# Patient Record
Sex: Male | Born: 1975 | Race: White | Hispanic: No | Marital: Married | Smoking: Former smoker
Health system: Southern US, Community
[De-identification: ages and names within clinical notes are randomized; demographics above are authoritative.]

## PROBLEM LIST (undated history)

## (undated) DIAGNOSIS — F4310 Post-traumatic stress disorder, unspecified: Secondary | ICD-10-CM

## (undated) DIAGNOSIS — N529 Male erectile dysfunction, unspecified: Secondary | ICD-10-CM

## (undated) DIAGNOSIS — E119 Type 2 diabetes mellitus without complications: Secondary | ICD-10-CM

## (undated) DIAGNOSIS — M1990 Unspecified osteoarthritis, unspecified site: Secondary | ICD-10-CM

## (undated) DIAGNOSIS — E781 Pure hyperglyceridemia: Secondary | ICD-10-CM

## (undated) DIAGNOSIS — I10 Essential (primary) hypertension: Secondary | ICD-10-CM

## (undated) HISTORY — DX: Pure hyperglyceridemia: E78.1

## (undated) HISTORY — PX: NO PAST SURGERIES: SHX2092

---

## 1997-11-24 ENCOUNTER — Emergency Department (HOSPITAL_COMMUNITY): Admission: EM | Admit: 1997-11-24 | Discharge: 1997-11-24 | Payer: Self-pay | Admitting: Emergency Medicine

## 2016-01-27 ENCOUNTER — Encounter (INDEPENDENT_AMBULATORY_CARE_PROVIDER_SITE_OTHER): Payer: Self-pay

## 2016-01-27 ENCOUNTER — Encounter: Payer: Self-pay | Admitting: Sports Medicine

## 2016-01-27 ENCOUNTER — Ambulatory Visit (INDEPENDENT_AMBULATORY_CARE_PROVIDER_SITE_OTHER): Payer: BLUE CROSS/BLUE SHIELD

## 2016-01-27 ENCOUNTER — Ambulatory Visit (INDEPENDENT_AMBULATORY_CARE_PROVIDER_SITE_OTHER): Payer: BLUE CROSS/BLUE SHIELD | Admitting: Sports Medicine

## 2016-01-27 DIAGNOSIS — L723 Sebaceous cyst: Secondary | ICD-10-CM | POA: Diagnosis not present

## 2016-01-27 DIAGNOSIS — Z0000 Encounter for general adult medical examination without abnormal findings: Secondary | ICD-10-CM

## 2016-01-27 DIAGNOSIS — M19072 Primary osteoarthritis, left ankle and foot: Secondary | ICD-10-CM | POA: Diagnosis not present

## 2016-01-27 DIAGNOSIS — E781 Pure hyperglyceridemia: Secondary | ICD-10-CM

## 2016-01-27 DIAGNOSIS — L84 Corns and callosities: Secondary | ICD-10-CM | POA: Diagnosis not present

## 2016-01-27 DIAGNOSIS — M2012 Hallux valgus (acquired), left foot: Secondary | ICD-10-CM

## 2016-01-27 LAB — HIV ANTIBODY (ROUTINE TESTING W REFLEX): HIV 1&2 Ab, 4th Generation: NONREACTIVE

## 2016-01-27 LAB — COMPREHENSIVE METABOLIC PANEL WITH GFR
ALT: 46 U/L (ref 9–46)
AST: 26 U/L (ref 10–40)
Alkaline Phosphatase: 128 U/L — ABNORMAL HIGH (ref 40–115)
BUN: 14 mg/dL (ref 7–25)
Creat: 1.09 mg/dL (ref 0.60–1.35)
Glucose, Bld: 106 mg/dL — ABNORMAL HIGH (ref 65–99)
Total Bilirubin: 0.4 mg/dL (ref 0.2–1.2)

## 2016-01-27 LAB — HEMOGLOBIN A1C
Hgb A1c MFr Bld: 5.7 % — ABNORMAL HIGH (ref <5.7)
Mean Plasma Glucose: 117 mg/dL

## 2016-01-27 LAB — CBC
HCT: 45.4 % (ref 38.5–50.0)
Hemoglobin: 15.3 g/dL (ref 13.2–17.1)
MCH: 29.4 pg (ref 27.0–33.0)
MCHC: 33.7 g/dL (ref 32.0–36.0)
MCV: 87.3 fL (ref 80.0–100.0)
MPV: 12 fL (ref 7.5–12.5)
Platelets: 211 K/uL (ref 140–400)
RBC: 5.2 MIL/uL (ref 4.20–5.80)
RDW: 14 % (ref 11.0–15.0)
WBC: 8.3 K/uL (ref 3.8–10.8)

## 2016-01-27 LAB — LIPID PANEL
Cholesterol: 191 mg/dL (ref <200)
HDL: 21 mg/dL — ABNORMAL LOW (ref >40)
LDL Cholesterol: 100 mg/dL — ABNORMAL HIGH (ref <100)
Total CHOL/HDL Ratio: 9.1 Ratio — ABNORMAL HIGH (ref <5.0)
Triglycerides: 352 mg/dL — ABNORMAL HIGH (ref <150)
VLDL: 70 mg/dL — ABNORMAL HIGH (ref <30)

## 2016-01-27 LAB — COMPREHENSIVE METABOLIC PANEL
Albumin: 4.5 g/dL (ref 3.6–5.1)
CO2: 27 mmol/L (ref 20–31)
Calcium: 9.8 mg/dL (ref 8.6–10.3)
Chloride: 102 mmol/L (ref 98–110)
Potassium: 5.2 mmol/L (ref 3.5–5.3)
Sodium: 139 mmol/L (ref 135–146)
Total Protein: 7.7 g/dL (ref 6.1–8.1)

## 2016-01-27 LAB — TSH: TSH: 2.35 m[IU]/L (ref 0.40–4.50)

## 2016-01-27 MED ORDER — MELOXICAM 15 MG PO TABS: ORAL_TABLET | ORAL | 3 refills | Status: DC

## 2016-01-27 NOTE — Assessment & Plan Note (Signed)
X-rays, meloxicam, if no better in one month we can inject this.

## 2016-01-27 NOTE — Progress Notes (Signed)
  Subjective:    CC: Establish care.   HPI:  This is a pleasant 40 year old male, he comes in with only a few complaints.  Preventive measures: Needs routine blood work obtained.  Sebaceous cyst: Back of the neck, desires surgical excision.  Lesion on right thumb: Over the pad of the thumb, mild pain.  Left foot pain: Localized at the first MTP.  Past medical history:  Negative.  See flowsheet/record as well for more information.  Surgical history: Negative.  See flowsheet/record as well for more information.  Family history: Negative.  See flowsheet/record as well for more information.  Social history: Negative.  See flowsheet/record as well for more information.  Allergies, and medications have been entered into the medical record, reviewed, and no changes needed.    Review of Systems: No headache, visual changes, nausea, vomiting, diarrhea, constipation, dizziness, abdominal pain, skin rash, fevers, chills, night sweats, swollen lymph nodes, weight loss, chest pain, body aches, joint swelling, muscle aches, shortness of breath, mood changes, visual or auditory hallucinations.  Objective:    General: Well Developed, well nourished, and in no acute distress.  Neuro: Alert and oriented x3, extra-ocular muscles intact, sensation grossly intact.  HEENT: Normocephalic, atraumatic, pupils equal round reactive to light, neck supple, no masses, no lymphadenopathy, thyroid nonpalpable. There is a nearly fist-sized sebaceous cyst in the midline of the posterior neck  Skin: Warm and dry, no rashes noted.  Cardiac: Regular rate and rhythm, no murmurs rubs or gallops.  Respiratory: Clear to auscultation bilaterally. Not using accessory muscles, speaking in full sentences.  Abdominal: Soft, nontender, nondistended, positive bowel sounds, no masses, no organomegaly.  Left foot: Visible hallux valgus, tender to palpation at the first MTP. Right thumb: Corn in the pad of the thumb.  Impression  and Recommendations:    The patient was counselled, risk factors were discussed, anticipatory guidance given.  Annual physical exam Up-to-date on tetanus, checking routine blood work.  Sebaceous cyst Return in a 30 minute slot for surgical excision.  Corn I can core out the corn at his sebaceous cyst appointment  Hallux valgus, left X-rays, meloxicam, if no better in one month we can inject this.

## 2016-01-27 NOTE — Assessment & Plan Note (Signed)
Return in a 30 minute slot for surgical excision.

## 2016-01-27 NOTE — Assessment & Plan Note (Signed)
Up-to-date on tetanus, checking routine blood work.

## 2016-01-27 NOTE — Assessment & Plan Note (Signed)
I can core out the corn at his sebaceous cyst appointment

## 2016-01-28 DIAGNOSIS — E781 Pure hyperglyceridemia: Secondary | ICD-10-CM

## 2016-01-28 HISTORY — DX: Pure hyperglyceridemia: E78.1

## 2016-01-28 LAB — VITAMIN D 25 HYDROXY (VIT D DEFICIENCY, FRACTURES): Vit D, 25-Hydroxy: 20 ng/mL — ABNORMAL LOW (ref 30–100)

## 2016-01-28 MED ORDER — VITAMIN D (ERGOCALCIFEROL) 1.25 MG (50000 UNIT) PO CAPS: 50000.0000 [IU] | ORAL_CAPSULE | ORAL | 0 refills | Status: DC

## 2016-01-28 MED ORDER — FENOFIBRATE 160 MG PO TABS: 160.0000 mg | ORAL_TABLET | Freq: Every day | ORAL | 3 refills | Status: DC

## 2016-01-28 NOTE — Addendum Note (Signed)
Addended by: Monica BectonHEKKEKANDAM, THOMAS J on: 01/28/2016 09:09 AM   Modules accepted: Orders

## 2016-01-28 NOTE — Assessment & Plan Note (Signed)
Isolated hypertriglyceridemia, adding fenofibrate 160.  Recheck in 3 months.

## 2016-02-04 ENCOUNTER — Ambulatory Visit (INDEPENDENT_AMBULATORY_CARE_PROVIDER_SITE_OTHER): Payer: BLUE CROSS/BLUE SHIELD | Admitting: Sports Medicine

## 2016-02-04 ENCOUNTER — Encounter: Payer: Self-pay | Admitting: Sports Medicine

## 2016-02-04 DIAGNOSIS — L723 Sebaceous cyst: Secondary | ICD-10-CM | POA: Diagnosis not present

## 2016-02-04 MED ORDER — HYDROCODONE-ACETAMINOPHEN 5-325 MG PO TABS: 1.0000 | ORAL_TABLET | Freq: Three times a day (TID) | ORAL | 0 refills | Status: DC | PRN

## 2016-02-04 NOTE — Assessment & Plan Note (Signed)
Surgical excision with primary closure. Hydrocodone for pain. Return in one week for suture removal.

## 2016-02-04 NOTE — Progress Notes (Signed)
  Procedure:  Excision of   Risks, benefits, and alternatives explained and consent obtained. Time out conducted. Surface prepped with alcohol. 10cc lidocaine with epinephine infiltrated in a field block. Adequate anesthesia ensured. Area prepped and draped in a sterile fashion. Excision performed with: I made a linear incision over the sebaceous cyst, using both sharp and blunt dissection I was able to remove it nearly and block with some loss of contents during the procedure, the cavity was then inspected and found to be clear of any remnants of the cyst wall, I closed the incision with several simple interrupted 3-0 Ethilon sutures and a single horizontal mattress 3-0 Ethilon suture. Hemostasis achieved. Pt stable.

## 2016-02-07 HISTORY — PX: CYST EXCISION: SHX5701

## 2016-02-11 ENCOUNTER — Encounter: Payer: Self-pay | Admitting: Sports Medicine

## 2016-02-11 ENCOUNTER — Ambulatory Visit (INDEPENDENT_AMBULATORY_CARE_PROVIDER_SITE_OTHER): Payer: BLUE CROSS/BLUE SHIELD | Admitting: Sports Medicine

## 2016-02-11 DIAGNOSIS — L723 Sebaceous cyst: Secondary | ICD-10-CM

## 2016-02-11 MED ORDER — DOXYCYCLINE HYCLATE 100 MG PO TABS: 100.0000 mg | ORAL_TABLET | Freq: Two times a day (BID) | ORAL | 0 refills | Status: AC

## 2016-02-11 NOTE — Progress Notes (Signed)
  Subjective: 1 week post surgical excision of a large neck sebaceous cyst, doing well, having some burning sensations.   Objective: General: Well-developed, well-nourished, and in no acute distress. Neck: Incision is clean, dry, intact, I removed the horizontal mattress supporting suture, cleaned the wound and redressed it. It has a bit of erythema. No drainage, no malodorous discharge.  Assessment/plan:   Sebaceous cyst Doing well post surgical excision of large sebaceous cyst. He can increase his pain medication to a half tab every 4 hours instead of one tab every 8 hours, it seems to be running out after 4 hours. I left the simple interrupted sutures in place, I like to see him back in one week for consideration of removal of these. I'm going to add a short course of doxycycline.

## 2016-02-11 NOTE — Assessment & Plan Note (Signed)
Doing well post surgical excision of large sebaceous cyst. He can increase his pain medication to a half tab every 4 hours instead of one tab every 8 hours, it seems to be running out after 4 hours. I left the simple interrupted sutures in place, I like to see him back in one week for consideration of removal of these. I'm going to add a short course of doxycycline.

## 2016-02-18 ENCOUNTER — Encounter: Payer: Self-pay | Admitting: Sports Medicine

## 2016-02-18 ENCOUNTER — Ambulatory Visit (INDEPENDENT_AMBULATORY_CARE_PROVIDER_SITE_OTHER): Payer: BLUE CROSS/BLUE SHIELD | Admitting: Sports Medicine

## 2016-02-18 DIAGNOSIS — L723 Sebaceous cyst: Secondary | ICD-10-CM

## 2016-02-18 NOTE — Assessment & Plan Note (Addendum)
Continuing to do well after sebaceous cyst excision, remaining sutures removed, return as needed.

## 2016-02-18 NOTE — Progress Notes (Signed)
  Subjective: 2 weeks post large sebaceous cyst excision of the neck, doing well   Objective: General: Well-developed, well-nourished, and in no acute distress. Incision: Clean, dry, intact, remaining simple interrupted sutures are removed, wound is flat, there is some scabbing in the scabs were left alone.  Assessment/plan:   Sebaceous cyst Continuing to do well after sebaceous cyst excision, remaining sutures removed, return as needed.

## 2016-02-22 ENCOUNTER — Ambulatory Visit (INDEPENDENT_AMBULATORY_CARE_PROVIDER_SITE_OTHER): Payer: BLUE CROSS/BLUE SHIELD | Admitting: Sports Medicine

## 2016-02-22 ENCOUNTER — Encounter: Payer: Self-pay | Admitting: Sports Medicine

## 2016-02-22 DIAGNOSIS — L723 Sebaceous cyst: Secondary | ICD-10-CM

## 2016-02-22 DIAGNOSIS — E781 Pure hyperglyceridemia: Secondary | ICD-10-CM

## 2016-02-22 NOTE — Progress Notes (Signed)
  Subjective: Couple of weeks post sebaceous cyst excision on the neck, there was concern about the has since of the wound. He is here for me to check it.   Objective: General: Well-developed, well-nourished, and in no acute distress. Neck: Wound is clean, dry, there is some overlying scab, it has somewhat broken down but only slightly so but minimal enough where does not need revision. I debrided the wound edges, pinched it together and placed a bit of Dermabond.  Assessment/plan:   Sebaceous cyst There was a mild wound dehiscence, this was derided and closed with Dermabond. Return to see me in 2 weeks for a final additional wound check but all looks well.

## 2016-02-22 NOTE — Assessment & Plan Note (Signed)
There was a mild wound dehiscence, this was derided and closed with Dermabond. Return to see me in 2 weeks for a final additional wound check but all looks well.

## 2016-02-22 NOTE — Assessment & Plan Note (Signed)
Need to recheck triglycerides in about 2 months, orders written

## 2016-03-08 ENCOUNTER — Encounter: Payer: Self-pay | Admitting: Sports Medicine

## 2016-03-08 ENCOUNTER — Ambulatory Visit (INDEPENDENT_AMBULATORY_CARE_PROVIDER_SITE_OTHER): Payer: BLUE CROSS/BLUE SHIELD | Admitting: Sports Medicine

## 2016-03-08 DIAGNOSIS — L723 Sebaceous cyst: Secondary | ICD-10-CM

## 2016-03-08 NOTE — Progress Notes (Signed)
  Subjective:    CC: Follow-up  HPI: This is a pleasant 41 year old male, works at [employer], we surgically removed a large sebaceous cyst, he returns today with his incision well-healed, needs some paperwork filled out.  Past medical history:  Negative.  See flowsheet/record as well for more information.  Surgical history: Negative.  See flowsheet/record as well for more information.  Family history: Negative.  See flowsheet/record as well for more information.  Social history: Negative.  See flowsheet/record as well for more information.  Allergies, and medications have been entered into the medical record, reviewed, and no changes needed.   Review of Systems: No fevers, chills, night sweats, weight loss, chest pain, or shortness of breath.   Objective:    General: Well Developed, well nourished, and in no acute distress.  Neuro: Alert and oriented x3, extra-ocular muscles intact, sensation grossly intact.  HEENT: Normocephalic, atraumatic, pupils equal round reactive to light, neck supple, no masses, no lymphadenopathy, thyroid nonpalpable.  Skin: Warm and dry, no rashes. Cardiac: Regular rate and rhythm, no murmurs rubs or gallops, no lower extremity edema.  Respiratory: Clear to auscultation bilaterally. Not using accessory muscles, speaking in full sentences. Back of neck: Incision is clean, dry, intact, well-healed.  Impression and Recommendations:    Sebaceous cyst Incision has healed well. FMLA paperwork filled out today. Return as needed.

## 2016-03-08 NOTE — Assessment & Plan Note (Signed)
Incision has healed well. FMLA paperwork filled out today. Return as needed.

## 2016-04-20 ENCOUNTER — Ambulatory Visit: Payer: BLUE CROSS/BLUE SHIELD | Admitting: Sports Medicine

## 2016-04-20 ENCOUNTER — Telehealth: Payer: Self-pay | Admitting: Sports Medicine

## 2016-04-20 DIAGNOSIS — E781 Pure hyperglyceridemia: Secondary | ICD-10-CM

## 2016-04-20 LAB — COMPREHENSIVE METABOLIC PANEL
ALT: 40 U/L (ref 9–46)
AST: 25 U/L (ref 10–40)
Albumin: 4.5 g/dL (ref 3.6–5.1)
Alkaline Phosphatase: 112 U/L (ref 40–115)
BUN: 14 mg/dL (ref 7–25)
Calcium: 10.2 mg/dL (ref 8.6–10.3)
Glucose, Bld: 118 mg/dL — ABNORMAL HIGH (ref 65–99)
Potassium: 5.4 mmol/L — ABNORMAL HIGH (ref 3.5–5.3)
Sodium: 141 mmol/L (ref 135–146)
Total Bilirubin: 0.4 mg/dL (ref 0.2–1.2)
Total Protein: 7.8 g/dL (ref 6.1–8.1)

## 2016-04-20 LAB — LIPID PANEL
Cholesterol: 186 mg/dL (ref <200)
HDL: 21 mg/dL — ABNORMAL LOW (ref >40)
LDL Cholesterol: 113 mg/dL — ABNORMAL HIGH (ref <100)
Total CHOL/HDL Ratio: 8.9 Ratio — ABNORMAL HIGH (ref <5.0)
Triglycerides: 258 mg/dL — ABNORMAL HIGH (ref <150)
VLDL: 52 mg/dL — ABNORMAL HIGH (ref <30)

## 2016-04-20 LAB — COMPREHENSIVE METABOLIC PANEL WITH GFR
CO2: 29 mmol/L (ref 20–31)
Chloride: 106 mmol/L (ref 98–110)
Creat: 1.23 mg/dL (ref 0.60–1.35)

## 2016-04-20 NOTE — Telephone Encounter (Signed)
Thanks

## 2016-04-20 NOTE — Assessment & Plan Note (Signed)
Noted elevated triglycerides, patient does admit only taking fenofibrate 3 times per week, he will increase to daily and we can recheck in 3 months.

## 2016-04-20 NOTE — Telephone Encounter (Signed)
Pt returned clinic call stating he takes the fenofibrate "about 3 times a week or so." States he will start taking it daily.

## 2016-05-12 ENCOUNTER — Encounter: Payer: Self-pay | Admitting: Sports Medicine

## 2016-05-12 MED ORDER — VARENICLINE TARTRATE 1 MG PO TABS: 1.0000 mg | ORAL_TABLET | Freq: Two times a day (BID) | ORAL | 3 refills | Status: DC

## 2016-05-12 MED ORDER — VARENICLINE TARTRATE 0.5 MG X 11 & 1 MG X 42 PO MISC: ORAL | 0 refills | Status: DC

## 2016-06-20 ENCOUNTER — Ambulatory Visit (INDEPENDENT_AMBULATORY_CARE_PROVIDER_SITE_OTHER): Payer: BLUE CROSS/BLUE SHIELD | Admitting: Sports Medicine

## 2016-06-20 DIAGNOSIS — L723 Sebaceous cyst: Secondary | ICD-10-CM

## 2016-06-20 DIAGNOSIS — E781 Pure hyperglyceridemia: Secondary | ICD-10-CM

## 2016-06-20 DIAGNOSIS — S39012A Strain of muscle, fascia and tendon of lower back, initial encounter: Secondary | ICD-10-CM

## 2016-06-20 DIAGNOSIS — M51369 Other intervertebral disc degeneration, lumbar region without mention of lumbar back pain or lower extremity pain: Secondary | ICD-10-CM | POA: Insufficient documentation

## 2016-06-20 DIAGNOSIS — M5136 Other intervertebral disc degeneration, lumbar region: Secondary | ICD-10-CM | POA: Insufficient documentation

## 2016-06-20 LAB — COMPREHENSIVE METABOLIC PANEL WITH GFR
ALT: 36 U/L (ref 9–46)
Alkaline Phosphatase: 98 U/L (ref 40–115)
BUN: 14 mg/dL (ref 7–25)
Chloride: 104 mmol/L (ref 98–110)
Creat: 1.21 mg/dL (ref 0.60–1.35)
Sodium: 139 mmol/L (ref 135–146)
Total Protein: 7.5 g/dL (ref 6.1–8.1)

## 2016-06-20 LAB — COMPREHENSIVE METABOLIC PANEL
AST: 22 U/L (ref 10–40)
Albumin: 4.5 g/dL (ref 3.6–5.1)
CO2: 21 mmol/L (ref 20–31)
Calcium: 10 mg/dL (ref 8.6–10.3)
Glucose, Bld: 106 mg/dL — ABNORMAL HIGH (ref 65–99)
Potassium: 4.4 mmol/L (ref 3.5–5.3)
Total Bilirubin: 0.5 mg/dL (ref 0.2–1.2)

## 2016-06-20 LAB — LIPID PANEL W/REFLEX DIRECT LDL
Cholesterol: 183 mg/dL (ref <200)
HDL: 24 mg/dL — ABNORMAL LOW (ref >40)
LDL-Cholesterol: 133 mg/dL — ABNORMAL HIGH
Non-HDL Cholesterol (Calc): 159 mg/dL — ABNORMAL HIGH (ref <130)
Total CHOL/HDL Ratio: 7.6 Ratio — ABNORMAL HIGH (ref <5.0)
Triglycerides: 150 mg/dL — ABNORMAL HIGH (ref <150)

## 2016-06-20 MED ORDER — CYCLOBENZAPRINE HCL 10 MG PO TABS: ORAL_TABLET | ORAL | 0 refills | Status: DC

## 2016-06-20 MED ORDER — PREDNISONE 50 MG PO TABS: ORAL_TABLET | ORAL | 0 refills | Status: DC

## 2016-06-20 NOTE — Progress Notes (Signed)
  Subjective:    CC: Back pain  HPI: Last night this pleasant 41 year old male was trying to move an air conditioner, he pushed it and felt a strain and a pressure sensation on the left side of his mid back, no radiation. It kept him from sleeping last night, no radiation, no bowel or bladder dysfunction, saddle numbness, constitutional symptoms. He also has a bit of pain previous site of his sebaceous cyst excision.  Hypertriglyceridemia: Has been religious with his fenofibrate, desires to recheck his lipids.  Past medical history:  Negative.  See flowsheet/record as well for more information.  Surgical history: Negative.  See flowsheet/record as well for more information.  Family history: Negative.  See flowsheet/record as well for more information.  Social history: Negative.  See flowsheet/record as well for more information.  Allergies, and medications have been entered into the medical record, reviewed, and no changes needed.   Review of Systems: No fevers, chills, night sweats, weight loss, chest pain, or shortness of breath.   Objective:    General: Well Developed, well nourished, and in no acute distress.  Neuro: Alert and oriented x3, extra-ocular muscles intact, sensation grossly intact.  HEENT: Normocephalic, atraumatic, pupils equal round reactive to light, neck supple, no masses, no lymphadenopathy, thyroid nonpalpable.  Skin: Warm and dry, no rashes. Cardiac: Regular rate and rhythm, no murmurs rubs or gallops, no lower extremity edema.  Respiratory: Clear to auscultation bilaterally. Not using accessory muscles, speaking in full sentences. Back Exam:  Inspection: Unremarkable  Motion: Flexion 45 deg, Extension 45 deg, Side Bending to 45 deg bilaterally,  Rotation to 45 deg bilaterally  SLR laying: Negative  XSLR laying: Negative  Palpable tenderness: Left quadratus lumborum. FABER: negative. Sensory change: Gross sensation intact to all lumbar and sacral dermatomes.    Reflexes: 2+ at both patellar tendons, 2+ at achilles tendons, Babinski's downgoing.  Strength at foot  Plantar-flexion: 5/5 Dorsi-flexion: 5/5 Eversion: 5/5 Inversion: 5/5  Leg strength  Quad: 5/5 Hamstring: 5/5 Hip flexor: 5/5 Hip abductors: 5/5  Gait unremarkable.  Procedure:  Injection of left quadratus lumborum trigger point Consent obtained and verified. Time-out conducted. Noted no overlying erythema, induration, or other signs of local infection. Skin prepped in a sterile fashion. Topical analgesic spray: Ethyl chloride. Completed without difficulty. Meds: 1/2 mL Kenalog 40, 1/2 mL lidocaine, 1/2 mL bupivacaine injected easily. Pain immediately improved suggesting accurate placement of the medication. Advised to call if fevers/chills, erythema, induration, drainage, or persistent bleeding.  Procedure:  Injection of cervical sebaceous cyst excision scar Consent obtained and verified. Time-out conducted. Noted no overlying erythema, induration, or other signs of local infection. Skin prepped in a sterile fashion. Topical analgesic spray: Ethyl chloride. Completed without difficulty. Meds: 1/2 mL kenalog 40 40, 1/2 mL lidocaine, 1/2 mL bupivacaine injected easily. Pain immediately improved suggesting accurate placement of the medication. Advised to call if fevers/chills, erythema, induration, drainage, or persistent bleeding.  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 Prednisone, Flexeril at bedtime, rehabilitation exercises given, out of work for 2 days.  Sebaceous cyst Having a little bit of post-incisional pain, trigger point injection placed here today.  Hypertriglyceridemia Has been religious with fenofibrate, rechecking lipids today.

## 2016-06-20 NOTE — Assessment & Plan Note (Signed)
Has been religious with fenofibrate, rechecking lipids today.

## 2016-06-20 NOTE — Assessment & Plan Note (Signed)
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 Prednisone, Flexeril at bedtime, rehabilitation exercises given, out of work for 2 days.

## 2016-06-20 NOTE — Assessment & Plan Note (Signed)
Having a little bit of post-incisional pain, trigger point injection placed here today.

## 2016-06-22 ENCOUNTER — Telehealth: Payer: Self-pay

## 2016-06-22 NOTE — Telephone Encounter (Signed)
Letter in box. 

## 2016-06-22 NOTE — Telephone Encounter (Signed)
Wife of pt called stating pt was unable to work today due to continued pain. Pt would like a not excusing him from work from Tuesday until Monday. Please advise.

## 2016-06-23 ENCOUNTER — Ambulatory Visit: Payer: BLUE CROSS/BLUE SHIELD | Admitting: Sports Medicine

## 2016-07-21 ENCOUNTER — Other Ambulatory Visit: Payer: Self-pay | Admitting: Sports Medicine

## 2016-07-21 DIAGNOSIS — M2012 Hallux valgus (acquired), left foot: Secondary | ICD-10-CM

## 2016-08-08 ENCOUNTER — Ambulatory Visit: Payer: BLUE CROSS/BLUE SHIELD | Admitting: Sports Medicine

## 2016-09-22 ENCOUNTER — Encounter: Payer: Self-pay | Admitting: Sports Medicine

## 2016-09-22 ENCOUNTER — Ambulatory Visit (INDEPENDENT_AMBULATORY_CARE_PROVIDER_SITE_OTHER): Payer: BLUE CROSS/BLUE SHIELD | Admitting: Sports Medicine

## 2016-09-22 DIAGNOSIS — L989 Disorder of the skin and subcutaneous tissue, unspecified: Secondary | ICD-10-CM | POA: Diagnosis not present

## 2016-09-22 DIAGNOSIS — E663 Overweight: Secondary | ICD-10-CM | POA: Diagnosis not present

## 2016-09-22 DIAGNOSIS — N529 Male erectile dysfunction, unspecified: Secondary | ICD-10-CM

## 2016-09-22 MED ORDER — PHENTERMINE HCL 37.5 MG PO TABS: ORAL_TABLET | ORAL | 0 refills | Status: DC

## 2016-09-22 MED ORDER — SILDENAFIL CITRATE 50 MG PO TABS: 50.0000 mg | ORAL_TABLET | Freq: Every day | ORAL | 3 refills | Status: DC | PRN

## 2016-09-22 NOTE — Assessment & Plan Note (Signed)
Starting phentermine. Adding Colace for constipation, return monthly for weight checks and refills. Nutritionist referral.

## 2016-09-22 NOTE — Assessment & Plan Note (Signed)
Petechia over the left hand and right arm. Adding a CBC, he does have one lesion on the dorsum of his left hand but tends to bleed often present for a while. Next line if this is not resolved after the next month of wearing long sleeves and gloves when working I will do a biopsy to ensure this is not a squamous cell carcinoma.

## 2016-09-22 NOTE — Progress Notes (Signed)
  Subjective:    CC: Weight loss  HPI: Carl Griffith is a pleasant 41 year old male, he would like some help losing weight.  Skin lesion: Has bruising on both hands mostly from work, he has noted one lesion on the dorsum of the left hand that tends to bleed often, has been present for over a week.  Erectile dysfunction: Decreased quality, duration of erections, would like treatment for this.  Past medical history:  Negative.  See flowsheet/record as well for more information.  Surgical history: Negative.  See flowsheet/record as well for more information.  Family history: Negative.  See flowsheet/record as well for more information.  Social history: Negative.  See flowsheet/record as well for more information.  Allergies, and medications have been entered into the medical record, reviewed, and no changes needed.   Review of Systems: No fevers, chills, night sweats, weight loss, chest pain, or shortness of breath.   Objective:    General: Well Developed, well nourished, and in no acute distress.  Neuro: Alert and oriented x3, extra-ocular muscles intact, sensation grossly intact.  HEENT: Normocephalic, atraumatic, pupils equal round reactive to light, neck supple, no masses, no lymphadenopathy, thyroid nonpalpable.  Skin: Warm and dry, no rashes.There are several small ecchymoses on the dorsum of both forearms and hands, there is a larger ecchymosis on the dorsum of the left hand with some overlying scaling. Cardiac: Regular rate and rhythm, no murmurs rubs or gallops, no lower extremity edema.  Respiratory: Clear to auscultation bilaterally. Not using accessory muscles, speaking in full sentences.   Impression and Recommendations:    Overweight (BMI 25.0-29.9) Starting phentermine. Adding Colace for constipation, return monthly for weight checks and refills. Nutritionist referral.  Skin lesion of hand Petechia over the left hand and right arm. Adding a CBC, he does have one lesion on  the dorsum of his left hand but tends to bleed often present for a while. Next line if this is not resolved after the next month of wearing long sleeves and gloves when working I will do a biopsy to ensure this is not a squamous cell carcinoma.  Erectile dysfunction Prescription for Viagra with discount coupon. Return to discuss this in one month.  I spent 25 minutes with this patient, greater than 50% was face-to-face time counseling regarding the above diagnoses

## 2016-09-22 NOTE — Assessment & Plan Note (Signed)
Prescription for Viagra with discount coupon. Return to discuss this in one month.

## 2016-09-23 LAB — CBC
HCT: 43.4 % (ref 38.5–50.0)
Hemoglobin: 14.3 g/dL (ref 13.2–17.1)
MCH: 29.5 pg (ref 27.0–33.0)
MCHC: 32.9 g/dL (ref 32.0–36.0)
MCV: 89.5 fL (ref 80.0–100.0)
MPV: 12.2 fL (ref 7.5–12.5)
Platelets: 197 10*3/uL (ref 140–400)
RBC: 4.85 MIL/uL (ref 4.20–5.80)
RDW: 14.1 % (ref 11.0–15.0)
WBC: 7.5 10*3/uL (ref 3.8–10.8)

## 2016-10-24 ENCOUNTER — Ambulatory Visit (INDEPENDENT_AMBULATORY_CARE_PROVIDER_SITE_OTHER): Payer: BLUE CROSS/BLUE SHIELD | Admitting: Sports Medicine

## 2016-10-24 ENCOUNTER — Encounter: Payer: Self-pay | Admitting: Sports Medicine

## 2016-10-24 DIAGNOSIS — E663 Overweight: Secondary | ICD-10-CM | POA: Diagnosis not present

## 2016-10-24 MED ORDER — PHENTERMINE HCL 37.5 MG PO TABS: ORAL_TABLET | ORAL | 0 refills | Status: DC

## 2016-10-24 NOTE — Progress Notes (Signed)
  Subjective:    CC: Follow-up  HPI: Obesity: Good/6 with loss after the first month, no adverse effects. Does get some recurrence of hunger by the end of the day for dinner.  Past medical history:  Negative.  See flowsheet/record as well for more information.  Surgical history: Negative.  See flowsheet/record as well for more information.  Family history: Negative.  See flowsheet/record as well for more information.  Social history: Negative.  See flowsheet/record as well for more information.  Allergies, and medications have been entered into the medical record, reviewed, and no changes needed.   Review of Systems: No fevers, chills, night sweats, weight loss, chest pain, or shortness of breath.   Objective:    General: Well Developed, well nourished, and in no acute distress.  Neuro: Alert and oriented x3, extra-ocular muscles intact, sensation grossly intact.  HEENT: Normocephalic, atraumatic, pupils equal round reactive to light, neck supple, no masses, no lymphadenopathy, thyroid nonpalpable.  Skin: Warm and dry, no rashes. Cardiac: Regular rate and rhythm, no murmurs rubs or gallops, no lower extremity edema.  Respiratory: Clear to auscultation bilaterally. Not using accessory muscles, speaking in full sentences.  Impression and Recommendations:    Overweight (BMI 25.0-29.9) 6lb weight loss after the first month. He will play around with a half tab twice a day versus a full 7 the morning.  Return in one month, entering the second month.  I spent 25 minutes with this patient, greater than 50% was face-to-face time counseling regarding the above diagnoses ___________________________________________ [name]. [name], M.D., ABFM., CAQSM. Primary Care and Sports Medicine [place]  Adjunct Instructor of Family Medicine  [employer]

## 2016-10-24 NOTE — Assessment & Plan Note (Signed)
6lb weight loss after the first month. He will play around with a half tab twice a day versus a full 7 the morning.  Return in one month, entering the second month.

## 2016-11-23 ENCOUNTER — Encounter: Payer: Self-pay | Admitting: Sports Medicine

## 2016-11-23 ENCOUNTER — Ambulatory Visit (INDEPENDENT_AMBULATORY_CARE_PROVIDER_SITE_OTHER): Payer: BLUE CROSS/BLUE SHIELD | Admitting: Sports Medicine

## 2016-11-23 DIAGNOSIS — E663 Overweight: Secondary | ICD-10-CM

## 2016-11-23 MED ORDER — PHENTERMINE HCL 37.5 MG PO TABS: ORAL_TABLET | ORAL | 0 refills | Status: DC

## 2016-11-23 NOTE — Assessment & Plan Note (Signed)
Good weight loss after the second month, entering the third month.

## 2016-11-23 NOTE — Progress Notes (Signed)
  Subjective:    CC: Weight check  HPI: Carl Griffith returns, he is a pleasant 41 year old male, been helping him with weight loss, he continues to lose good weight, has noted improved efficacy with one half tab twice a day, he has discussed his condition with nutrition.  Happy to continue.  No adverse effects.  Past medical history:  Negative.  See flowsheet/record as well for more information.  Surgical history: Negative.  See flowsheet/record as well for more information.  Family history: Negative.  See flowsheet/record as well for more information.  Social history: Negative.  See flowsheet/record as well for more information.  Allergies, and medications have been entered into the medical record, reviewed, and no changes needed.   Review of Systems: No fevers, chills, night sweats, weight loss, chest pain, or shortness of breath.   Objective:    General: Well Developed, well nourished, and in no acute distress.  Neuro: Alert and oriented x3, extra-ocular muscles intact, sensation grossly intact.  HEENT: Normocephalic, atraumatic, pupils equal round reactive to light, neck supple, no masses, no lymphadenopathy, thyroid nonpalpable.  Skin: Warm and dry, no rashes. Cardiac: Regular rate and rhythm, no murmurs rubs or gallops, no lower extremity edema.  Respiratory: Clear to auscultation bilaterally. Not using accessory muscles, speaking in full sentences.  Impression and Recommendations:    Overweight (BMI 25.0-29.9) Good weight loss after the second month, entering the third month.  I spent 25 minutes with this patient, greater than 50% was face-to-face time counseling regarding the above diagnoses ___________________________________________ Ihor Austinhomas J. Benjamin Stainhekkekandam, M.D., ABFM., CAQSM. Primary Care and Sports Medicine [place] [facility]  Adjunct Instructor of Family Medicine  [employer]

## 2016-12-21 ENCOUNTER — Ambulatory Visit: Payer: BLUE CROSS/BLUE SHIELD | Admitting: Sports Medicine

## 2016-12-21 DIAGNOSIS — Z0189 Encounter for other specified special examinations: Secondary | ICD-10-CM

## 2017-01-19 ENCOUNTER — Other Ambulatory Visit: Payer: Self-pay | Admitting: Sports Medicine

## 2017-01-19 DIAGNOSIS — E781 Pure hyperglyceridemia: Secondary | ICD-10-CM

## 2017-02-02 ENCOUNTER — Encounter: Payer: Self-pay | Admitting: Family Medicine

## 2017-02-02 ENCOUNTER — Other Ambulatory Visit: Payer: Self-pay | Admitting: Family Medicine

## 2017-02-02 ENCOUNTER — Ambulatory Visit (HOSPITAL_BASED_OUTPATIENT_CLINIC_OR_DEPARTMENT_OTHER)
Admission: RE | Admit: 2017-02-02 | Discharge: 2017-02-02 | Disposition: A | Discharge status: Home / Self Care | Payer: BLUE CROSS/BLUE SHIELD | Source: Ambulatory Visit | Attending: Family Medicine | Admitting: Family Medicine

## 2017-02-02 ENCOUNTER — Encounter (HOSPITAL_BASED_OUTPATIENT_CLINIC_OR_DEPARTMENT_OTHER): Payer: Self-pay

## 2017-02-02 ENCOUNTER — Ambulatory Visit (INDEPENDENT_AMBULATORY_CARE_PROVIDER_SITE_OTHER): Payer: BLUE CROSS/BLUE SHIELD

## 2017-02-02 ENCOUNTER — Ambulatory Visit: Payer: BLUE CROSS/BLUE SHIELD | Admitting: Family Medicine

## 2017-02-02 VITALS — BP 136/92 | HR 96 | Ht 71.0 in | Wt 203.0 lb

## 2017-02-02 DIAGNOSIS — M5137 Other intervertebral disc degeneration, lumbosacral region: Secondary | ICD-10-CM

## 2017-02-02 DIAGNOSIS — M545 Low back pain, unspecified: Secondary | ICD-10-CM

## 2017-02-02 MED ORDER — PREDNISONE 50 MG PO TABS: 50.0000 mg | ORAL_TABLET | Freq: Every day | ORAL | 0 refills | Status: DC

## 2017-02-02 NOTE — Patient Instructions (Signed)
Thank you for coming in today. Take prednisone daily for 5 days.  Use a heating pad.  Use a TENS unit.  Attend PT.  Recheck with me or Dr T in about 4 weeks if not better.  Come back or go to the emergency room if you notice new weakness new numbness problems walking or bowel or bladder problems.  TENS UNIT: This is helpful for muscle pain and spasm.   Search and Purchase a TENS 7000 2nd edition at  www.tenspros.com or www.Amazon.com It should be less than $30.     TENS unit instructions: Do not shower or bathe with the unit on Turn the unit off before removing electrodes or batteries If the electrodes lose stickiness add a drop of water to the electrodes after they are disconnected from the unit and place on plastic sheet. If you continued to have difficulty, call the TENS unit company to purchase more electrodes. Do not apply lotion on the skin area prior to use. Make sure the skin is clean and dry as this will help prolong the life of the electrodes. After use, always check skin for unusual red areas, rash or other skin difficulties. If there are any skin problems, does not apply electrodes to the same area. Never remove the electrodes from the unit by pulling the wires. Do not use the TENS unit or electrodes other than as directed. Do not change electrode placement without consultating your therapist or physician. Keep 2 fingers with between each electrode. Wear time ratio is 2:1, on to off times.    For example on for 30 minutes off for 15 minutes and then on for 30 minutes off for 15 minutes     Lumbosacral Strain Lumbosacral strain is an injury that causes pain in the lower back (lumbosacral spine). This injury usually occurs from overstretching the muscles or ligaments along your spine. A strain can affect one or more muscles or cord-like tissues that connect bones to other bones (ligaments). What are the causes? This condition may be caused by:  A hard, direct hit  (blow) to the back.  Excessive stretching of the lower back muscles. This may result from: ? A fall. ? Lifting something heavy. ? Repetitive movements such as bending or crouching.  What increases the risk? The following factors may increase your risk of getting this condition:  Participating in sports or activities that involve: ? A sudden twist of the back. ? Pushing or pulling motions.  Being overweight or obese.  Having poor strength and flexibility, especially tight hamstrings or weak muscles in the back or abdomen.  Having too much of a curve in the lower back.  Having a pelvis that is tilted forward.  What are the signs or symptoms? The main symptom of this condition is pain in the lower back, at the site of the strain. Pain may extend (radiate) down one or both legs. How is this diagnosed? This condition is diagnosed based on:  Your symptoms.  Your medical history.  A physical exam. ? Your health care provider may push on certain areas of your back to determine the source of your pain. ? You may be asked to bend forward, backward, and side to side to assess the severity of your pain and your range of motion.  Imaging tests, such as: ? X-rays. ? MRI.  How is this treated? Treatment for this condition may include:  Putting heat and cold on the affected area.  Medicines to help relieve pain and  relax your muscles (muscle relaxants).  NSAIDs to help reduce swelling and discomfort.  When your symptoms improve, it is important to gradually return to your normal routine as soon as possible to reduce pain, avoid stiffness, and avoid loss of muscle strength. Generally, symptoms should improve within 6 weeks of treatment. However, recovery time varies. Follow these instructions at home: Managing pain, stiffness, and swelling   If directed, put ice on the injured area during the first 24 hours after your strain. ? Put ice in a plastic bag. ? Place a towel between  your skin and the bag. ? Leave the ice on for 20 minutes, 2-3 times a day.  If directed, put heat on the affected area as often as told by your health care provider. Use the heat source that your health care provider recommends, such as a moist heat pack or a heating pad. ? Place a towel between your skin and the heat source. ? Leave the heat on for 20-30 minutes. ? Remove the heat if your skin turns bright red. This is especially important if you are unable to feel pain, heat, or cold. You may have a greater risk of getting burned. Activity  Rest and return to your normal activities as told by your health care provider. Ask your health care provider what activities are safe for you.  Avoid activities that take a lot of energy for as long as told by your health care provider. General instructions  Take over-the-counter and prescription medicines only as told by your health care provider.  Donot drive or use heavy machinery while taking prescription pain medicine.  Do not use any products that contain nicotine or tobacco, such as cigarettes and e-cigarettes. If you need help quitting, ask your health care provider.  Keep all follow-up visits as told by your health care provider. This is important. How is this prevented?  Use correct form when playing sports and lifting heavy objects.  Use good posture when sitting and standing.  Maintain a healthy weight.  Sleep on a mattress with medium firmness to support your back.  Be safe and responsible while being active to avoid falls.  Do at least 150 minutes of moderate-intensity exercise each week, such as brisk walking or water aerobics. Try a form of exercise that takes stress off your back, such as swimming or stationary cycling.  Maintain physical fitness, including: ? Strength. ? Flexibility. ? Cardiovascular fitness. ? Endurance. Contact a health care provider if:  Your back pain does not improve after 6 weeks of  treatment.  Your symptoms get worse. Get help right away if:  Your back pain is severe.  You cannot stand or walk.  You have difficulty controlling when you urinate or when you have a bowel movement.  You feel nauseous or you vomit.  Your feet get very cold.  You have numbness, tingling, weakness, or problems using your arms or legs.  You develop any of the following: ? Shortness of breath. ? Dizziness. ? Pain in your legs. ? Weakness in your buttocks or legs. ? Discoloration of the skin on your toes or legs. This information is not intended to replace advice given to you by your health care provider. Make sure you discuss any questions you have with your health care provider. Document Released: 11/02/2004 Document Revised: 08/13/2015 Document Reviewed: 06/27/2015 Elsevier Interactive Patient Education  [employer]2018 Elsevier Inc.

## 2017-02-02 NOTE — Progress Notes (Signed)
Carl ReedyLarry Griffith is a 41 y.o. male who presents to [facility] today for right low back pain for 4-6 weeks.  Carl NajjarLarry denies any injury.  He denies any pain radiating to his right leg.  He does note pain radiating to the right groin however.  He denies any weakness numbness or bowel or bladder dysfunction.  No fevers or chills or unexplained weight loss.  He notes that the pain is worsened by activity and better typically with rest however the pain does occur at night and will interfere with sleep.  He is tried meloxicam and relative rest which have not helped.   Past Medical History:  Diagnosis Date  . Hypertriglyceridemia 01/28/2016   History reviewed. No pertinent surgical history. Social History   Tobacco Use  . Smoking status: Former Smoker    Packs/day: 1.50    Years: 15.00    Pack years: 22.50    Last attempt to quit: 07/23/2016    Years since quitting: 0.5  . Smokeless tobacco: Never Used  Substance Use Topics  . Alcohol use: No     ROS:  As above   Medications: Current Outpatient Medications  Medication Sig Dispense Refill  . fenofibrate 160 MG tablet TAKE 1 TABLET (160 MG TOTAL) BY MOUTH DAILY. 90 tablet 3  . meloxicam (MOBIC) 15 MG tablet TAKE ONE TABLET BY MOUTH EACH MORNING WITH BREAKFAST FOR 2 WEEKS, THEN TAKE DAILY ONLY AS NEEDED 30 tablet 3  . phentermine (ADIPEX-P) 37.5 MG tablet One tab by mouth qAM 30 tablet 0  . sildenafil (VIAGRA) 50 MG tablet Take 1 tablet (50 mg total) by mouth daily as needed for erectile dysfunction. 10 tablet 3  . predniSONE (DELTASONE) 50 MG tablet Take 1 tablet (50 mg total) by mouth daily. 5 tablet 0   No current facility-administered medications for this visit.    No Known Allergies   Exam:  BP (!) 136/92   Pulse 96   Ht 5\' 11"  (1.803 m)   Wt 203 lb (92.1 kg)   BMI 28.31 kg/m  General: Well Developed, well nourished, and in no acute distress.  Neuro/Psych: Alert and oriented  x3, extra-ocular muscles intact, able to move all 4 extremities, sensation grossly intact. Skin: Warm and dry, no rashes noted.  Respiratory: Not using accessory muscles, speaking in full sentences, trachea midline.  Cardiovascular: Pulses palpable, no extremity edema. Abdomen: Does not appear distended. MSK:  L-spine: Nontender to spinal midline. Tender to palpation right lumbar paraspinal muscle group. Range of motion: Flexion limited extension normal lateral flexion and rotation are normal however somewhat painful to the right Lower extremity strength reflexes and sensation are intact and equal throughout Normal gait     No results found for this or any previous visit (from the past 48 hour(s)). Dg Lumbar Spine Complete  Result Date: 02/02/2017 CLINICAL DATA:  Low back pain for the past month. EXAM: LUMBAR SPINE - COMPLETE 4+ VIEW COMPARISON:  None. FINDINGS: There are 5 non rib-bearing lumbar type vertebral bodies. Mild straightening of the expected lumbar lordosis. No anterolisthesis or retrolisthesis. Lumbar vertebral body heights appear preserved. Mild to moderate multilevel lumbar spine DDD, worse at L5-S1 and to a lesser extent, L4-L5 with disc space height loss and endplate sclerosis. Limited visualization of the bilateral SI joints and hips is normal. Punctate phleboliths overlies the right hemipelvis. Moderate colonic stool burden without evidence of enteric obstruction. IMPRESSION: 1. Mild straightening expected lumbar lordosis, nonspecific though could be seen  in the setting of muscle spasm. 2. Mild-to-moderate multilevel lumbar spine DDD, worse at L5-S1. Electronically Signed   By: Simonne ComeJohn  Watts M.D.   On: 02/02/2017 09:08      Assessment and Plan: 41 y.o. male with low back pain with possible sacral radiculopathy.  I think the pain is likely somewhat related to DJD but also lumbosacral myofascial disruption and dysfunction.. Plan for referral to physical therapy and a short  course of prednisone.  Recheck in a few weeks especially if not better.    Orders Placed This Encounter  Procedures  . DG Lumbar Spine Complete    Standing Status:   Future    Number of Occurrences:   1    Standing Expiration Date:   04/05/2018    Order Specific Question:   Reason for Exam (SYMPTOM  OR DIAGNOSIS REQUIRED)    Answer:   eval right low back pain x 6 weeks    Order Specific Question:   Preferred imaging location?    [facility]    Order Specific Question:   Radiology Contrast Protocol - do NOT remove file path    Answer:   [email or web address]  . Ambulatory referral to Physical Therapy    Referral Priority:   Routine    Referral Type:   Physical Medicine    Referral Reason:   Specialty Services Required    Requested Specialty:   Physical Therapy   Meds ordered this encounter  Medications  . predniSONE (DELTASONE) 50 MG tablet    Sig: Take 1 tablet (50 mg total) by mouth daily.    Dispense:  5 tablet    Refill:  0    Discussed warning signs or symptoms. Please see discharge instructions. Patient expresses understanding.

## 2017-02-09 ENCOUNTER — Encounter: Payer: Self-pay | Admitting: Family Medicine

## 2017-02-16 ENCOUNTER — Encounter: Payer: Self-pay | Admitting: Sports Medicine

## 2017-02-16 MED ORDER — PREDNISONE 10 MG (48) PO TBPK: ORAL_TABLET | Freq: Every day | ORAL | 0 refills | Status: DC

## 2017-03-02 ENCOUNTER — Ambulatory Visit: Payer: BLUE CROSS/BLUE SHIELD | Admitting: Sports Medicine

## 2017-03-05 ENCOUNTER — Ambulatory Visit: Payer: BLUE CROSS/BLUE SHIELD | Admitting: Sports Medicine

## 2017-10-12 ENCOUNTER — Ambulatory Visit: Payer: BLUE CROSS/BLUE SHIELD | Admitting: Sports Medicine

## 2017-10-12 ENCOUNTER — Encounter: Payer: Self-pay | Admitting: Sports Medicine

## 2017-10-12 ENCOUNTER — Ambulatory Visit (INDEPENDENT_AMBULATORY_CARE_PROVIDER_SITE_OTHER): Payer: BLUE CROSS/BLUE SHIELD

## 2017-10-12 DIAGNOSIS — S6992XA Unspecified injury of left wrist, hand and finger(s), initial encounter: Secondary | ICD-10-CM

## 2017-10-12 DIAGNOSIS — E663 Overweight: Secondary | ICD-10-CM | POA: Diagnosis not present

## 2017-10-12 DIAGNOSIS — X501XXA Overexertion from prolonged static or awkward postures, initial encounter: Secondary | ICD-10-CM | POA: Diagnosis not present

## 2017-10-12 DIAGNOSIS — Z0000 Encounter for general adult medical examination without abnormal findings: Secondary | ICD-10-CM | POA: Diagnosis not present

## 2017-10-12 DIAGNOSIS — R2232 Localized swelling, mass and lump, left upper limb: Secondary | ICD-10-CM | POA: Insufficient documentation

## 2017-10-12 MED ORDER — PHENTERMINE HCL 37.5 MG PO TABS: ORAL_TABLET | ORAL | 0 refills | Status: DC

## 2017-10-12 NOTE — Assessment & Plan Note (Signed)
Elevated blood sugar noted on a work screening. Adding a hemoglobin A1c, CMP.

## 2017-10-12 NOTE — Progress Notes (Signed)
Subjective:    CC: Wrist pain  HPI: This is a pleasant 42 year old male, he was moving his trash can, felt a pop on the ulnar aspect of his wrist, had pain, swelling.  This occurred about a day ago.  Symptoms are moderate, persistent, localized without radiation.  Overweight: Would like to restart phentermine.  I reviewed the past medical history, family history, social history, surgical history, and allergies today and no changes were needed.  Please see the problem list section below in epic for further details.  Past Medical History: Past Medical History:  Diagnosis Date  . Hypertriglyceridemia 01/28/2016   Past Surgical History: No past surgical history on file. Social History: Social History   Socioeconomic History  . Marital status: Married    Spouse name: Not on file  . Number of children: Not on file  . Years of education: Not on file  . Highest education level: Not on file  Occupational History  . Not on file  Social Needs  . Financial resource strain: Not on file  . Food insecurity:    Worry: Not on file    Inability: Not on file  . Transportation needs:    Medical: Not on file    Non-medical: Not on file  Tobacco Use  . Smoking status: Former Smoker    Packs/day: 1.50    Years: 15.00    Pack years: 22.50    Last attempt to quit: 07/23/2016    Years since quitting: 1.2  . Smokeless tobacco: Never Used  Substance and Sexual Activity  . Alcohol use: No  . Drug use: No  . Sexual activity: Yes    Partners: Female    Birth control/protection: None  Lifestyle  . Physical activity:    Days per week: Not on file    Minutes per session: Not on file  . Stress: Not on file  Relationships  . Social connections:    Talks on phone: Not on file    Gets together: Not on file    Attends religious service: Not on file    Active member of club or organization: Not on file    Attends meetings of clubs or organizations: Not on file    Relationship status: Not on  file  Other Topics Concern  . Not on file  Social History Narrative  . Not on file   Family History: Family History  Problem Relation Age of Onset  . Diabetes Mother   . Hypertension Mother   . Heart attack Mother   . Cancer Maternal Grandmother   . Hypertension Maternal Grandmother   . Diabetes Maternal Grandmother   . Heart attack Maternal Grandmother   . Hypertension Maternal Grandfather   . Heart attack Maternal Grandfather    Allergies: No Known Allergies Medications: See med rec.  Review of Systems: No fevers, chills, night sweats, weight loss, chest pain, or shortness of breath.   Objective:    General: Well Developed, well nourished, and in no acute distress.  Neuro: Alert and oriented x3, extra-ocular muscles intact, sensation grossly intact.  HEENT: Normocephalic, atraumatic, pupils equal round reactive to light, neck supple, no masses, no lymphadenopathy, thyroid nonpalpable.  Skin: Warm and dry, no rashes. Cardiac: Regular rate and rhythm, no murmurs rubs or gallops, no lower extremity edema.  Respiratory: Clear to auscultation bilaterally. Not using accessory muscles, speaking in full sentences. Left wrist: Slightly full over the ulnar aspect, tenderness over the TFCC. ROM smooth and normal with good flexion and extension  and ulnar/radial deviation that is symmetrical with opposite wrist. Palpation is normal over metacarpals, navicular, lunate; tendons without tenderness/ swelling No snuffbox tenderness. No tenderness over Canal of Guyon. Strength 5/5 in all directions without pain. Negative tinel's and phalens signs. Negative Finkelstein sign. Negative Watson's test.  Impression and Recommendations:    Left wrist injury Twisted wrist while pulling a trash can, pain is over the ulnar aspect of the wrist consistent with either ECU subluxation versus TFCC injury. Velcro wrist brace, meloxicam, x-rays. Return to see me in 2 to 4 weeks, injection if no  better.  Overweight (BMI 25.0-29.9) Restarting phentermine, return monthly for weight check.  Annual physical exam Elevated blood sugar noted on a work screening. Adding a hemoglobin A1c, CMP. ___________________________________________ Carl Griffith. Carl Griffith, M.D., ABFM., CAQSM. Primary Care and Sports Medicine [facility]  Adjunct Instructor of Family Medicine  [employer]

## 2017-10-12 NOTE — Assessment & Plan Note (Signed)
Restarting phentermine, return monthly for weight check.

## 2017-10-12 NOTE — Assessment & Plan Note (Signed)
Twisted wrist while pulling a trash can, pain is over the ulnar aspect of the wrist consistent with either ECU subluxation versus TFCC injury. Velcro wrist brace, meloxicam, x-rays. Return to see me in 2 to 4 weeks, injection if no better.

## 2017-10-13 LAB — CBC
HCT: 39.5 % (ref 38.5–50.0)
Hemoglobin: 13.3 g/dL (ref 13.2–17.1)
MCH: 28.9 pg (ref 27.0–33.0)
MCHC: 33.7 g/dL (ref 32.0–36.0)
MCV: 85.9 fL (ref 80.0–100.0)
MPV: 12.1 fL (ref 7.5–12.5)
Platelets: 189 Thousand/uL (ref 140–400)
RBC: 4.6 10*6/uL (ref 4.20–5.80)
RDW: 13.1 % (ref 11.0–15.0)
WBC: 5.2 10*3/uL (ref 3.8–10.8)

## 2017-10-13 LAB — COMPREHENSIVE METABOLIC PANEL
AG Ratio: 1.8 (calc) (ref 1.0–2.5)
ALT: 57 U/L — ABNORMAL HIGH (ref 9–46)
AST: 31 U/L (ref 10–40)
CO2: 27 mmol/L (ref 20–32)
Chloride: 105 mmol/L (ref 98–110)
Globulin: 2.6 g/dL (calc) (ref 1.9–3.7)
Glucose, Bld: 117 mg/dL (ref 65–139)
Sodium: 139 mmol/L (ref 135–146)

## 2017-10-13 LAB — HEMOGLOBIN A1C
Hgb A1c MFr Bld: 6.3 %{Hb} — ABNORMAL HIGH (ref <5.7)
Mean Plasma Glucose: 134 (calc)
eAG (mmol/L): 7.4 (calc)

## 2017-10-13 LAB — COMPREHENSIVE METABOLIC PANEL WITH GFR
Albumin: 4.6 g/dL (ref 3.6–5.1)
Alkaline phosphatase (APISO): 103 U/L (ref 40–115)
BUN: 14 mg/dL (ref 7–25)
Calcium: 9.9 mg/dL (ref 8.6–10.3)
Creat: 1.26 mg/dL (ref 0.60–1.35)
Potassium: 4.2 mmol/L (ref 3.5–5.3)
Total Bilirubin: 0.3 mg/dL (ref 0.2–1.2)
Total Protein: 7.2 g/dL (ref 6.1–8.1)

## 2017-11-12 ENCOUNTER — Encounter: Payer: Self-pay | Admitting: Sports Medicine

## 2017-11-12 ENCOUNTER — Ambulatory Visit (INDEPENDENT_AMBULATORY_CARE_PROVIDER_SITE_OTHER): Payer: BLUE CROSS/BLUE SHIELD | Admitting: Sports Medicine

## 2017-11-12 DIAGNOSIS — R2232 Localized swelling, mass and lump, left upper limb: Secondary | ICD-10-CM | POA: Diagnosis not present

## 2017-11-12 DIAGNOSIS — E663 Overweight: Secondary | ICD-10-CM | POA: Diagnosis not present

## 2017-11-12 MED ORDER — PHENTERMINE HCL 37.5 MG PO TABS: ORAL_TABLET | ORAL | 0 refills | Status: DC

## 2017-11-12 NOTE — Assessment & Plan Note (Signed)
Persistent pain, fullness over the ulnar aspect of the wrist. My initial concern was ECU subluxation versus TFCC injury. The mass is fairly prominent, possibly extrusion of the triangle fibrocartilaginous complex, because of this mass and presence of symptoms for greater than 6 weeks in spite of physician directed conservative measures with nondiagnostic x-rays we are going to proceed with MRI with IV contrast of the left wrist.

## 2017-11-12 NOTE — Assessment & Plan Note (Signed)
3 pound weight loss, refilling phentermine. Entering the second month.

## 2017-11-12 NOTE — Progress Notes (Signed)
Subjective:    CC: Weight check  HPI: Evertte returns, restarted phentermine, he has only lost 3 pounds.  I reviewed the past medical history, family history, social history, surgical history, and allergies today and no changes were needed.  Please see the problem list section below in epic for further details.  Past Medical History: Past Medical History:  Diagnosis Date  . Hypertriglyceridemia 01/28/2016   Past Surgical History: No past surgical history on file. Social History: Social History   Socioeconomic History  . Marital status: Married    Spouse name: Not on file  . Number of children: Not on file  . Years of education: Not on file  . Highest education level: Not on file  Occupational History  . Not on file  Social Needs  . Financial resource strain: Not on file  . Food insecurity:    Worry: Not on file    Inability: Not on file  . Transportation needs:    Medical: Not on file    Non-medical: Not on file  Tobacco Use  . Smoking status: Former Smoker    Packs/day: 1.50    Years: 15.00    Pack years: 22.50    Last attempt to quit: 07/23/2016    Years since quitting: 1.3  . Smokeless tobacco: Never Used  Substance and Sexual Activity  . Alcohol use: No  . Drug use: No  . Sexual activity: Yes    Partners: Female    Birth control/protection: None  Lifestyle  . Physical activity:    Days per week: Not on file    Minutes per session: Not on file  . Stress: Not on file  Relationships  . Social connections:    Talks on phone: Not on file    Gets together: Not on file    Attends religious service: Not on file    Active member of club or organization: Not on file    Attends meetings of clubs or organizations: Not on file    Relationship status: Not on file  Other Topics Concern  . Not on file  Social History Narrative  . Not on file   Family History: Family History  Problem Relation Age of Onset  . Diabetes Mother   . Hypertension Mother   . Heart  attack Mother   . Cancer Maternal Grandmother   . Hypertension Maternal Grandmother   . Diabetes Maternal Grandmother   . Heart attack Maternal Grandmother   . Hypertension Maternal Grandfather   . Heart attack Maternal Grandfather    Allergies: No Known Allergies Medications: See med rec.  Review of Systems: No fevers, chills, night sweats, weight loss, chest pain, or shortness of breath.   Objective:    General: Well Developed, well nourished, and in no acute distress.  Neuro: Alert and oriented x3, extra-ocular muscles intact, sensation grossly intact.  HEENT: Normocephalic, atraumatic, pupils equal round reactive to light, neck supple, no masses, no lymphadenopathy, thyroid nonpalpable.  Skin: Warm and dry, no rashes. Cardiac: Regular rate and rhythm, no murmurs rubs or gallops, no lower extremity edema.  Respiratory: Clear to auscultation bilaterally. Not using accessory muscles, speaking in full sentences.  Impression and Recommendations:    Overweight (BMI 25.0-29.9) 3 pound weight loss, refilling phentermine. Entering the second month.  Mass of wrist, left Persistent pain, fullness over the ulnar aspect of the wrist. My initial concern was ECU subluxation versus TFCC injury. The mass is fairly prominent, possibly extrusion of the triangle fibrocartilaginous complex, because of  this mass and presence of symptoms for greater than 6 weeks in spite of physician directed conservative measures with nondiagnostic x-rays we are going to proceed with MRI with IV contrast of the left wrist.  ___________________________________________ [name]. [name], M.D., ABFM., CAQSM. Primary Care and Sports Medicine [facility]  Adjunct Instructor of Family Medicine  [employer]

## 2017-12-10 ENCOUNTER — Ambulatory Visit: Payer: BLUE CROSS/BLUE SHIELD | Admitting: Sports Medicine

## 2018-03-14 ENCOUNTER — Ambulatory Visit (INDEPENDENT_AMBULATORY_CARE_PROVIDER_SITE_OTHER): Payer: BLUE CROSS/BLUE SHIELD | Admitting: Sports Medicine

## 2018-03-14 ENCOUNTER — Ambulatory Visit (INDEPENDENT_AMBULATORY_CARE_PROVIDER_SITE_OTHER): Payer: BLUE CROSS/BLUE SHIELD

## 2018-03-14 DIAGNOSIS — M5136 Other intervertebral disc degeneration, lumbar region: Secondary | ICD-10-CM

## 2018-03-14 DIAGNOSIS — E663 Overweight: Secondary | ICD-10-CM

## 2018-03-14 DIAGNOSIS — M51369 Other intervertebral disc degeneration, lumbar region without mention of lumbar back pain or lower extremity pain: Secondary | ICD-10-CM

## 2018-03-14 MED ORDER — KETOROLAC TROMETHAMINE 30 MG/ML IJ SOLN
30.0000 mg | Freq: Once | INTRAMUSCULAR | Status: AC
  Administered 2018-03-14: 30 mg via INTRAMUSCULAR

## 2018-03-14 MED ORDER — CYCLOBENZAPRINE HCL 10 MG PO TABS: ORAL_TABLET | ORAL | 0 refills | Status: DC

## 2018-03-14 MED ORDER — METHYLPREDNISOLONE SODIUM SUCC 125 MG IJ SOLR
125.0000 mg | Freq: Once | INTRAMUSCULAR | Status: AC
  Administered 2018-03-14: 125 mg via INTRAMUSCULAR

## 2018-03-14 MED ORDER — PREDNISONE 50 MG PO TABS: ORAL_TABLET | ORAL | 0 refills | Status: DC

## 2018-03-14 NOTE — Assessment & Plan Note (Signed)
We will discuss maintenance weight loss medication at the follow-up visit. We may try Qsymia or simply do generic phentermine and generic topiramate.

## 2018-03-14 NOTE — Progress Notes (Signed)
Subjective:    CC: Back pain  HPI: Carl Griffith is a pleasant 43 year old male with mild multilevel DDD.  More recently he has had a flare in his pain, right-sided, radiation into the buttock and thigh but not past the knee, occasional radiation into the scrotum but no saddle numbness, constitutional symptoms, no progressive weakness.  Symptoms are severe, persistent.  I reviewed the past medical history, family history, social history, surgical history, and allergies today and no changes were needed.  Please see the problem list section below in epic for further details.  Past Medical History: Past Medical History:  Diagnosis Date  . Hypertriglyceridemia 01/28/2016   Past Surgical History: No past surgical history on file. Social History: Social History   Socioeconomic History  . Marital status: Married    Spouse name: Not on file  . Number of children: Not on file  . Years of education: Not on file  . Highest education level: Not on file  Occupational History  . Not on file  Social Needs  . Financial resource strain: Not on file  . Food insecurity:    Worry: Not on file    Inability: Not on file  . Transportation needs:    Medical: Not on file    Non-medical: Not on file  Tobacco Use  . Smoking status: Former Smoker    Packs/day: 1.50    Years: 15.00    Pack years: 22.50    Last attempt to quit: 07/23/2016    Years since quitting: 1.6  . Smokeless tobacco: Never Used  Substance and Sexual Activity  . Alcohol use: No  . Drug use: No  . Sexual activity: Yes    Partners: Female    Birth control/protection: None  Lifestyle  . Physical activity:    Days per week: Not on file    Minutes per session: Not on file  . Stress: Not on file  Relationships  . Social connections:    Talks on phone: Not on file    Gets together: Not on file    Attends religious service: Not on file    Active member of club or organization: Not on file    Attends meetings of clubs or  organizations: Not on file    Relationship status: Not on file  Other Topics Concern  . Not on file  Social History Narrative  . Not on file   Family History: Family History  Problem Relation Age of Onset  . Diabetes Mother   . Hypertension Mother   . Heart attack Mother   . Cancer Maternal Grandmother   . Hypertension Maternal Grandmother   . Diabetes Maternal Grandmother   . Heart attack Maternal Grandmother   . Hypertension Maternal Grandfather   . Heart attack Maternal Grandfather    Allergies: No Known Allergies Medications: See med rec.  Review of Systems: No fevers, chills, night sweats, weight loss, chest pain, or shortness of breath.   Objective:    General: Well Developed, well nourished, and in no acute distress.  Neuro: Alert and oriented x3, extra-ocular muscles intact, sensation grossly intact.  HEENT: Normocephalic, atraumatic, pupils equal round reactive to light, neck supple, no masses, no lymphadenopathy, thyroid nonpalpable.  Skin: Warm and dry, no rashes. Cardiac: Regular rate and rhythm, no murmurs rubs or gallops, no lower extremity edema.  Respiratory: Clear to auscultation bilaterally. Not using accessory muscles, speaking in full sentences. Back Exam:  Inspection: Unremarkable  Motion: Flexion 45 deg, Extension 45 deg, Side Bending to  45 deg bilaterally,  Rotation to 45 deg bilaterally  SLR laying: Negative  XSLR laying: Negative  Palpable tenderness: None. FABER: negative. Sensory change: Gross sensation intact to all lumbar and sacral dermatomes.  Reflexes: 2+ at both patellar tendons, 2+ at achilles tendons, Babinski's downgoing.  Strength at foot  Plantar-flexion: 5/5 Dorsi-flexion: 5/5 Eversion: 5/5 Inversion: 5/5  Leg strength  Quad: 5/5 Hamstring: 5/5 Hip flexor: 5/5 Hip abductors: 5/5  Gait unremarkable.  Toradol 30 and Solu-Medrol 125 given intramuscular today  Impression and Recommendations:    Lumbar degenerative disc  disease Axial discogenic back pain. Toradol, Solu-Medrol. Prednisone, Flexeril, out of work until Monday. X-rays. Return to see me in 2 to 4 weeks, we will work further on his weight loss at that point.  Overweight (BMI 25.0-29.9) We will discuss maintenance weight loss medication at the follow-up visit. We may try Qsymia or simply do generic phentermine and generic topiramate. ___________________________________________ Carl Griffith. [name], M.D., ABFM., CAQSM. Primary Care and Sports Medicine [facility]  Adjunct Professor of Family Medicine  [employer]

## 2018-03-14 NOTE — Assessment & Plan Note (Signed)
Axial discogenic back pain. Toradol, Solu-Medrol. Prednisone, Flexeril, out of work until Monday. X-rays. Return to see me in 2 to 4 weeks, we will work further on his weight loss at that point.

## 2018-03-20 ENCOUNTER — Other Ambulatory Visit: Payer: Self-pay | Admitting: Sports Medicine

## 2018-03-20 DIAGNOSIS — E781 Pure hyperglyceridemia: Secondary | ICD-10-CM

## 2018-04-16 ENCOUNTER — Ambulatory Visit: Payer: BLUE CROSS/BLUE SHIELD | Admitting: Sports Medicine

## 2018-05-25 ENCOUNTER — Encounter: Payer: Self-pay | Admitting: Sports Medicine

## 2018-05-27 MED ORDER — AMOXICILLIN-POT CLAVULANATE 875-125 MG PO TABS: 1.0000 | ORAL_TABLET | Freq: Two times a day (BID) | ORAL | 0 refills | Status: AC

## 2018-08-29 ENCOUNTER — Encounter: Payer: Self-pay | Admitting: Sports Medicine

## 2018-08-29 ENCOUNTER — Other Ambulatory Visit: Payer: Self-pay

## 2018-08-29 ENCOUNTER — Ambulatory Visit (INDEPENDENT_AMBULATORY_CARE_PROVIDER_SITE_OTHER): Payer: BC Managed Care – PPO | Admitting: Sports Medicine

## 2018-08-29 VITALS — BP 138/91 | HR 70 | Ht 71.0 in | Wt 206.0 lb

## 2018-08-29 DIAGNOSIS — Z23 Encounter for immunization: Secondary | ICD-10-CM | POA: Diagnosis not present

## 2018-08-29 DIAGNOSIS — E119 Type 2 diabetes mellitus without complications: Secondary | ICD-10-CM

## 2018-08-29 DIAGNOSIS — I10 Essential (primary) hypertension: Secondary | ICD-10-CM | POA: Diagnosis not present

## 2018-08-29 LAB — POCT GLYCOSYLATED HEMOGLOBIN (HGB A1C): Hemoglobin A1C: 7 % — AB (ref 4.0–5.6)

## 2018-08-29 MED ORDER — METFORMIN HCL ER 500 MG PO TB24: 500.0000 mg | ORAL_TABLET | Freq: Every day | ORAL | 11 refills | Status: DC

## 2018-08-29 MED ORDER — OZEMPIC (1 MG/DOSE) 2 MG/1.5ML ~~LOC~~ SOPN: 1.0000 mg | PEN_INJECTOR | SUBCUTANEOUS | 11 refills | Status: DC

## 2018-08-29 MED ORDER — OZEMPIC (0.25 OR 0.5 MG/DOSE) 2 MG/1.5ML ~~LOC~~ SOPN: 1.0000 | PEN_INJECTOR | SUBCUTANEOUS | 1 refills | Status: DC

## 2018-08-29 NOTE — Assessment & Plan Note (Signed)
We will watch this as his blood sugar comes down and his weight comes down. Goal is less than 130/80.

## 2018-08-29 NOTE — Assessment & Plan Note (Signed)
A1c is 7.3%, we are going to aggressively bring this lower, we discussed the natural history of diabetes, as well as the treatment and screening measures. See quality metrics flowsheet. Because he does need to lose a significant amount of weight we are going to add metformin as well as Ozempic. Return to see me in 3 months, may come back for a nurse visit sooner to learn how to do Ozempic injections.

## 2018-08-29 NOTE — Progress Notes (Signed)
Subjective:    CC: Follow-up  HPI: Carl Griffith is a pleasant 43 year old male recently diagnosed with diabetes through an employer/DOT screening.  He is asymptomatic.  I reviewed the past medical history, family history, social history, surgical history, and allergies today and no changes were needed.  Please see the problem list section below in epic for further details.  Past Medical History: Past Medical History:  Diagnosis Date  . Hypertriglyceridemia 01/28/2016   Past Surgical History: No past surgical history on file. Social History: Social History   Socioeconomic History  . Marital status: Married    Spouse name: Not on file  . Number of children: Not on file  . Years of education: Not on file  . Highest education level: Not on file  Occupational History  . Not on file  Social Needs  . Financial resource strain: Not on file  . Food insecurity    Worry: Not on file    Inability: Not on file  . Transportation needs    Medical: Not on file    Non-medical: Not on file  Tobacco Use  . Smoking status: Former Smoker    Packs/day: 1.50    Years: 15.00    Pack years: 22.50    Quit date: 07/23/2016    Years since quitting: 2.1  . Smokeless tobacco: Never Used  Substance and Sexual Activity  . Alcohol use: No  . Drug use: No  . Sexual activity: Yes    Partners: Female    Birth control/protection: None  Lifestyle  . Physical activity    Days per week: Not on file    Minutes per session: Not on file  . Stress: Not on file  Relationships  . Social Musicianconnections    Talks on phone: Not on file    Gets together: Not on file    Attends religious service: Not on file    Active member of club or organization: Not on file    Attends meetings of clubs or organizations: Not on file    Relationship status: Not on file  Other Topics Concern  . Not on file  Social History Narrative  . Not on file   Family History: Family History  Problem Relation Age of Onset  . Diabetes  Mother   . Hypertension Mother   . Heart attack Mother   . Cancer Maternal Grandmother   . Hypertension Maternal Grandmother   . Diabetes Maternal Grandmother   . Heart attack Maternal Grandmother   . Hypertension Maternal Grandfather   . Heart attack Maternal Grandfather    Allergies: No Known Allergies Medications: See med rec.  Review of Systems: No fevers, chills, night sweats, weight loss, chest pain, or shortness of breath.   Objective:    General: Well Developed, well nourished, and in no acute distress.  Neuro: Alert and oriented x3, extra-ocular muscles intact, sensation grossly intact.  HEENT: Normocephalic, atraumatic, pupils equal round reactive to light, neck supple, no masses, no lymphadenopathy, thyroid nonpalpable.  Skin: Warm and dry, no rashes. Cardiac: Regular rate and rhythm, no murmurs rubs or gallops, no lower extremity edema.  Respiratory: Clear to auscultation bilaterally. Not using accessory muscles, speaking in full sentences.  Impression and Recommendations:    Controlled type 2 diabetes mellitus without complication, without long-term current use of insulin (HCC) A1c is 7.3%, we are going to aggressively bring this lower, we discussed the natural history of diabetes, as well as the treatment and screening measures. See quality metrics flowsheet. Because he  does need to lose a significant amount of weight we are going to add metformin as well as Ozempic. Return to see me in 3 months, may come back for a nurse visit sooner to learn how to do Ozempic injections.  Benign essential hypertension We will watch this as his blood sugar comes down and his weight comes down. Goal is less than 130/80.  I spent 25 minutes with this patient, greater than 50% was face-to-face time counseling regarding the above diagnoses.  ___________________________________________ Gwen Her. Dianah Field, M.D., ABFM., CAQSM. Primary Care and Sports Medicine [place]  [facility]  Adjunct Professor of [place] [employer] [facility] [employer] [facility] [employer] [employer] [employer]

## 2018-09-04 ENCOUNTER — Encounter: Payer: Self-pay | Admitting: Sports Medicine

## 2018-09-09 MED ORDER — AMBULATORY NON FORMULARY MEDICATION: 3 refills | Status: AC

## 2018-11-29 ENCOUNTER — Other Ambulatory Visit: Payer: Self-pay

## 2018-11-29 ENCOUNTER — Encounter: Payer: Self-pay | Admitting: Sports Medicine

## 2018-11-29 ENCOUNTER — Ambulatory Visit: Payer: BC Managed Care – PPO | Admitting: Sports Medicine

## 2018-11-29 VITALS — BP 122/83 | HR 90 | Ht 71.0 in | Wt 178.0 lb

## 2018-11-29 DIAGNOSIS — E119 Type 2 diabetes mellitus without complications: Secondary | ICD-10-CM | POA: Diagnosis not present

## 2018-11-29 LAB — POCT GLYCOSYLATED HEMOGLOBIN (HGB A1C): Hemoglobin A1C: 5.2 % (ref 4.0–5.6)

## 2018-11-29 NOTE — Assessment & Plan Note (Signed)
A1c dropped from 7.3% down to 5.2% with Ozempic. He has lost about 30 pounds, he feels really good, happy with how things are going, return in 6 months. We can go ahead and discontinue Metformin. He will return the container for a urine microalbumin, he is unable to void today.

## 2018-11-29 NOTE — Progress Notes (Signed)
Subjective:    CC: Follow-up  HPI: Diabetes mellitus type 2: Exquisitely well controlled.  Additionally he had a 30 pound weight loss with Ozempic.  I reviewed the past medical history, family history, social history, surgical history, and allergies today and no changes were needed.  Please see the problem list section below in epic for further details.  Past Medical History: Past Medical History:  Diagnosis Date  . Hypertriglyceridemia 01/28/2016   Past Surgical History: No past surgical history on file. Social History: Social History   Socioeconomic History  . Marital status: Married    Spouse name: Not on file  . Number of children: Not on file  . Years of education: Not on file  . Highest education level: Not on file  Occupational History  . Not on file  Social Needs  . Financial resource strain: Not on file  . Food insecurity    Worry: Not on file    Inability: Not on file  . Transportation needs    Medical: Not on file    Non-medical: Not on file  Tobacco Use  . Smoking status: Former Smoker    Packs/day: 1.50    Years: 15.00    Pack years: 22.50    Quit date: 07/23/2016    Years since quitting: 2.3  . Smokeless tobacco: Never Used  Substance and Sexual Activity  . Alcohol use: No  . Drug use: No  . Sexual activity: Yes    Partners: Female    Birth control/protection: None  Lifestyle  . Physical activity    Days per week: Not on file    Minutes per session: Not on file  . Stress: Not on file  Relationships  . Social Herbalist on phone: Not on file    Gets together: Not on file    Attends religious service: Not on file    Active member of club or organization: Not on file    Attends meetings of clubs or organizations: Not on file    Relationship status: Not on file  Other Topics Concern  . Not on file  Social History Narrative  . Not on file   Family History: Family History  Problem Relation Age of Onset  . Diabetes Mother   .  Hypertension Mother   . Heart attack Mother   . Cancer Maternal Grandmother   . Hypertension Maternal Grandmother   . Diabetes Maternal Grandmother   . Heart attack Maternal Grandmother   . Hypertension Maternal Grandfather   . Heart attack Maternal Grandfather    Allergies: No Known Allergies Medications: See med rec.  Review of Systems: No fevers, chills, night sweats, weight loss, chest pain, or shortness of breath.   Objective:    General: Well Developed, well nourished, and in no acute distress.  Neuro: Alert and oriented x3, extra-ocular muscles intact, sensation grossly intact.  HEENT: Normocephalic, atraumatic, pupils equal round reactive to light, neck supple, no masses, no lymphadenopathy, thyroid nonpalpable.  Skin: Warm and dry, no rashes. Cardiac: Regular rate and rhythm, no murmurs rubs or gallops, no lower extremity edema.  Respiratory: Clear to auscultation bilaterally. Not using accessory muscles, speaking in full sentences.  Impression and Recommendations:    Controlled type 2 diabetes mellitus without complication, without long-term current use of insulin (HCC) A1c dropped from 7.3% down to 5.2% with Ozempic. He has lost about 30 pounds, he feels really good, happy with how things are going, return in 6 months. We can go  ahead and discontinue Metformin. He will return the container for a urine microalbumin, he is unable to void today.  I spent 25 minutes with this patient, greater than 50% was face-to-face time counseling regarding the above diagnoses.  ___________________________________________ [name]. [name], M.D., ABFM., CAQSM. Primary Care and Sports Medicine [place] [facility]  Adjunct Professor of Family Medicine  [employer] [facility] [employer] [facility] [employer]

## 2018-12-02 ENCOUNTER — Other Ambulatory Visit (INDEPENDENT_AMBULATORY_CARE_PROVIDER_SITE_OTHER): Payer: BC Managed Care – PPO

## 2018-12-02 DIAGNOSIS — E119 Type 2 diabetes mellitus without complications: Secondary | ICD-10-CM | POA: Diagnosis not present

## 2018-12-02 LAB — POCT UA - MICROALBUMIN
Albumin/Creatinine Ratio, Urine, POC: NORMAL
Creatinine, POC: 100 mg/dL
Microalbumin Ur, POC: 10 mg/L

## 2018-12-02 NOTE — Progress Notes (Signed)
Pt's wife Anderson Malta) dropped off urine specimen for pt. Results given to provider and updated into pt's chart.

## 2018-12-27 ENCOUNTER — Ambulatory Visit: Payer: BC Managed Care – PPO | Admitting: Sports Medicine

## 2019-01-06 ENCOUNTER — Other Ambulatory Visit: Payer: Self-pay

## 2019-01-06 ENCOUNTER — Encounter: Payer: Self-pay | Admitting: Sports Medicine

## 2019-01-06 ENCOUNTER — Ambulatory Visit (INDEPENDENT_AMBULATORY_CARE_PROVIDER_SITE_OTHER): Payer: BC Managed Care – PPO

## 2019-01-06 ENCOUNTER — Ambulatory Visit (INDEPENDENT_AMBULATORY_CARE_PROVIDER_SITE_OTHER): Payer: BC Managed Care – PPO | Admitting: Sports Medicine

## 2019-01-06 DIAGNOSIS — M5412 Radiculopathy, cervical region: Secondary | ICD-10-CM

## 2019-01-06 MED ORDER — PREDNISONE 50 MG PO TABS: ORAL_TABLET | ORAL | 0 refills | Status: DC

## 2019-01-06 MED ORDER — CYCLOBENZAPRINE HCL 10 MG PO TABS: ORAL_TABLET | ORAL | 0 refills | Status: DC

## 2019-01-06 NOTE — Assessment & Plan Note (Signed)
C7 and C8 distribution with progressive weakness, for this reason we are going to proceed with x-ray and MRI. Prednisone, Flexeril, out of work for 2 weeks. Physical therapy. Return to see me in a month.

## 2019-01-06 NOTE — Progress Notes (Signed)
Subjective:    CC: Neck and arm pain  HPI: For weeks this pleasant 43 year old male has had pain in his neck with radiation down the right arm, back of the forearm, to mostly the third, fourth, and fifth fingers.  Mild progressive weakness, moderate, persistent.  Much better with abduction of the right shoulder.  I reviewed the past medical history, family history, social history, surgical history, and allergies today and no changes were needed.  Please see the problem list section below in epic for further details.  Past Medical History: Past Medical History:  Diagnosis Date  . Hypertriglyceridemia 01/28/2016   Past Surgical History: No past surgical history on file. Social History: Social History   Socioeconomic History  . Marital status: Married    Spouse name: Not on file  . Number of children: Not on file  . Years of education: Not on file  . Highest education level: Not on file  Occupational History  . Not on file  Social Needs  . Financial resource strain: Not on file  . Food insecurity    Worry: Not on file    Inability: Not on file  . Transportation needs    Medical: Not on file    Non-medical: Not on file  Tobacco Use  . Smoking status: Former Smoker    Packs/day: 1.50    Years: 15.00    Pack years: 22.50    Quit date: 07/23/2016    Years since quitting: 2.4  . Smokeless tobacco: Never Used  Substance and Sexual Activity  . Alcohol use: No  . Drug use: No  . Sexual activity: Yes    Partners: Female    Birth control/protection: None  Lifestyle  . Physical activity    Days per week: Not on file    Minutes per session: Not on file  . Stress: Not on file  Relationships  . Social Herbalist on phone: Not on file    Gets together: Not on file    Attends religious service: Not on file    Active member of club or organization: Not on file    Attends meetings of clubs or organizations: Not on file    Relationship status: Not on file  Other  Topics Concern  . Not on file  Social History Narrative  . Not on file   Family History: Family History  Problem Relation Age of Onset  . Diabetes Mother   . Hypertension Mother   . Heart attack Mother   . Cancer Maternal Grandmother   . Hypertension Maternal Grandmother   . Diabetes Maternal Grandmother   . Heart attack Maternal Grandmother   . Hypertension Maternal Grandfather   . Heart attack Maternal Grandfather    Allergies: No Known Allergies Medications: See med rec.  Review of Systems: No fevers, chills, night sweats, weight loss, chest pain, or shortness of breath.   Objective:    General: Well Developed, well nourished, and in no acute distress.  Neuro: Alert and oriented x3, extra-ocular muscles intact, sensation grossly intact.  HEENT: Normocephalic, atraumatic, pupils equal round reactive to light, neck supple, no masses, no lymphadenopathy, thyroid nonpalpable.  Skin: Warm and dry, no rashes. Cardiac: Regular rate and rhythm, no murmurs rubs or gallops, no lower extremity edema.  Respiratory: Clear to auscultation bilaterally. Not using accessory muscles, speaking in full sentences. Neck: Negative spurling's Full neck range of motion Grip strength and sensation normal in bilateral hands Strength good C4 to T1 distribution No  sensory change to C4 to T1 Reflexes normal  Impression and Recommendations:    Right cervical radiculopathy C7 and C8 distribution with progressive weakness, for this reason we are going to proceed with x-ray and MRI. Prednisone, Flexeril, out of work for 2 weeks. Physical therapy. Return to see me in a month.   ___________________________________________ [name]. [name], M.D., ABFM., CAQSM. Primary Care and Sports Medicine [place] [facility]  Adjunct Professor of Family Medicine  [employer]

## 2019-01-07 ENCOUNTER — Ambulatory Visit (INDEPENDENT_AMBULATORY_CARE_PROVIDER_SITE_OTHER): Payer: BC Managed Care – PPO | Admitting: Sports Medicine

## 2019-01-07 ENCOUNTER — Other Ambulatory Visit: Payer: Self-pay

## 2019-01-07 ENCOUNTER — Encounter: Payer: Self-pay | Admitting: Sports Medicine

## 2019-01-07 VITALS — BP 131/77 | HR 101 | Ht 71.0 in | Wt 174.0 lb

## 2019-01-07 DIAGNOSIS — M5412 Radiculopathy, cervical region: Secondary | ICD-10-CM | POA: Diagnosis not present

## 2019-01-07 NOTE — Progress Notes (Signed)
   Short-term disability paperwork filled out.  I spent 15 minutes with this patient, greater than 50% was face-to-face time counseling regarding the above diagnoses.   ___________________________________________ Gwen Her. Dianah Field, M.D., ABFM., CAQSM. Primary Care and Sports Medicine [place] [facility]  Adjunct Professor of [place] [employer] [facility] [employer] Medicine

## 2019-01-10 ENCOUNTER — Ambulatory Visit (INDEPENDENT_AMBULATORY_CARE_PROVIDER_SITE_OTHER): Payer: BC Managed Care – PPO | Admitting: Sports Medicine

## 2019-01-10 ENCOUNTER — Encounter: Payer: Self-pay | Admitting: Physical Therapy

## 2019-01-10 ENCOUNTER — Other Ambulatory Visit: Payer: Self-pay

## 2019-01-10 ENCOUNTER — Ambulatory Visit (INDEPENDENT_AMBULATORY_CARE_PROVIDER_SITE_OTHER): Payer: BC Managed Care – PPO | Admitting: Physical Therapy

## 2019-01-10 DIAGNOSIS — M6281 Muscle weakness (generalized): Secondary | ICD-10-CM

## 2019-01-10 DIAGNOSIS — M5412 Radiculopathy, cervical region: Secondary | ICD-10-CM | POA: Diagnosis not present

## 2019-01-10 NOTE — Progress Notes (Addendum)
Subjective:    CC: Follow-up  HPI: Landis just needs some paperwork filled out again.  I reviewed the past medical history, family history, social history, surgical history, and allergies today and no changes were needed.  Please see the problem list section below in epic for further details.  Past Medical History: Past Medical History:  Diagnosis Date  . Hypertriglyceridemia 01/28/2016   Past Surgical History: No past surgical history on file. Social History: Social History   Socioeconomic History  . Marital status: Married    Spouse name: Not on file  . Number of children: Not on file  . Years of education: Not on file  . Highest education level: Not on file  Occupational History  . Not on file  Social Needs  . Financial resource strain: Not on file  . Food insecurity    Worry: Not on file    Inability: Not on file  . Transportation needs    Medical: Not on file    Non-medical: Not on file  Tobacco Use  . Smoking status: Former Smoker    Packs/day: 1.50    Years: 15.00    Pack years: 22.50    Quit date: 07/23/2016    Years since quitting: 2.4  . Smokeless tobacco: Never Used  Substance and Sexual Activity  . Alcohol use: No  . Drug use: No  . Sexual activity: Yes    Partners: Female    Birth control/protection: None  Lifestyle  . Physical activity    Days per week: Not on file    Minutes per session: Not on file  . Stress: Not on file  Relationships  . Social Herbalist on phone: Not on file    Gets together: Not on file    Attends religious service: Not on file    Active member of club or organization: Not on file    Attends meetings of clubs or organizations: Not on file    Relationship status: Not on file  Other Topics Concern  . Not on file  Social History Narrative  . Not on file   Family History: Family History  Problem Relation Age of Onset  . Diabetes Mother   . Hypertension Mother   . Heart attack Mother   . Cancer Maternal  Grandmother   . Hypertension Maternal Grandmother   . Diabetes Maternal Grandmother   . Heart attack Maternal Grandmother   . Hypertension Maternal Grandfather   . Heart attack Maternal Grandfather    Allergies: No Known Allergies Medications: See med rec.  Review of Systems: No fevers, chills, night sweats, weight loss, chest pain, or shortness of breath.   Objective:    General: Well Developed, well nourished, and in no acute distress.  Neuro: Alert and oriented x3, extra-ocular muscles intact, sensation grossly intact.  HEENT: Normocephalic, atraumatic, pupils equal round reactive to light, neck supple, no masses, no lymphadenopathy, thyroid nonpalpable.  Skin: Warm and dry, no rashes. Cardiac: Regular rate and rhythm, no murmurs rubs or gallops, no lower extremity edema.  Respiratory: Clear to auscultation bilaterally. Not using accessory muscles, speaking in full sentences.  Impression and Recommendations:    Right cervical radiculopathy C7 and C8 distribution with progressive weakness, awaiting MRI. Prednisone, Flexeril, out of work for 2 weeks, physical therapy, we filled out some additional disability paperwork today.  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   ___________________________________________ Gwen Her. Dianah Field, M.D., ABFM., CAQSM. Primary  Care and [place] Professor of [place] [employer] [facility] [employer] Medicine

## 2019-01-10 NOTE — Assessment & Plan Note (Addendum)
C7 and C8 distribution with progressive weakness, awaiting MRI. Prednisone, Flexeril, out of work for 2 weeks, physical therapy, we filled out some additional disability paperwork today.  MRI just obtained from [facility] shows large C6-C7 disc herniation with central canal stenosis.

## 2019-01-10 NOTE — Patient Instructions (Signed)
Access Code: [facility]  URL: [email or web address]  Date: 01/10/2019  Prepared by: Almyra Free Amarri Satterly   Exercises Seated Cervical Retraction - 10 reps - 1 sets - 3-5 sec hold - 4x daily - 7x weekly Supine Chin Tuck - 10 reps - 1 sets - 3-5 hold - 4x daily - 7x weekly                  Patient Education Forward Head Posture

## 2019-01-10 NOTE — Therapy (Signed)
[facility] [address] [place], [place], [address] Phone: [phone]   Fax:  [phone]  Physical Therapy Evaluation  Patient Details  Name: Carl Griffith MRN: [id number] Date of Birth: July 19, 1975 Referring Provider (PT): Dr. Benjamin Stainhekkekandam   Encounter Date: 01/10/2019  PT End of Session - 01/10/19 0757    Visit Number  1    Number of Visits  12    Date for PT Re-Evaluation  02/21/19    PT Start Time  0800    PT Stop Time  0842    PT Time Calculation (min)  42 min    Activity Tolerance  Patient tolerated treatment well    Behavior During Therapy  [facility] for tasks assessed/performed       Past Medical History:  Diagnosis Date  . Hypertriglyceridemia 01/28/2016    History reviewed. No pertinent surgical history.  There were no vitals filed for this visit.   Subjective Assessment - 01/10/19 0759    Subjective  Three weeks ago pt awoke with a crick in his neck, then worked it's way down his RUE. This past Sunday he could hardly lift his arm up. He feels intermittent numbness in lower arm and digits 3-5. He also feels weak in the entire arm. 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 Meds have helped at rest. Driving bothers him the most.    Pertinent History  DM    Diagnostic tests  mri scheduled for today    Patient Stated Goals  get rid of his sx in arm.    Currently in Pain?  Yes    Pain Score  5     Pain Location  Neck    Pain Orientation  Mid    Pain Descriptors / Indicators  Tightness    Pain Type  Acute pain    Pain Radiating Towards  RUE to digits 3-5    Pain Onset  1 to 4 weeks ago    Pain Frequency  Constant    Aggravating Factors   driving    Pain Relieving Factors  rest, meds    Effect of Pain on Daily Activities  limtied with ADLs and job         [facility] Assessment - 01/10/19 0001      Assessment   Medical Diagnosis  right cervical radiculopathy     Referring Provider (PT)  Dr. Benjamin Stainhekkekandam    Onset Date/Surgical Date  12/20/18    Hand Dominance  Right    Next MD Visit  today      Precautions   Precaution Comments  no lifting over 10#      Restrictions   Weight Bearing Restrictions  No      Balance Screen   Has the patient fallen in the past 6 months  No    Has the patient had a decrease in activity level because of a fear of falling?   No    Is the patient reluctant to leave their home because of a fear of falling?   No      Home Public house managernvironment   Living Environment  Private residence      Prior Function   Level of Independence  Independent    Vocation  Full time employment    Vocation Requirements  truck driving, pushing/pulling pallets      Posture/Postural Control   Posture Comments  mild forward head, tight left pecs  ROM / Strength   AROM / PROM / Strength  AROM;Strength      AROM   Overall AROM Comments  right shoulder WNL    AROM Assessment Site  Cervical    Cervical Flexion  30   with pain in C/T junction   Cervical Extension  --   WNL   Cervical - Right Side Bend  20    Cervical - Left Side Bend  22    Cervical - Right Rotation  52   pain into shoulder   Cervical - Left Rotation  54      Strength   Overall Strength Comments  Right shoulder flex, elbow flex, wrist ext 4+/5, else 5/5;     Strength Assessment Site  Hand    Right/Left hand  Right;Left    Right Hand Gross Grasp  --    Right Hand Grip (lbs)  88   79/95/89   Left Hand Gross Grasp  --    Left Hand Grip (lbs)  106   110/105/103     Palpation   Spinal mobility  PA at C6/7 painful    Palpation comment  tender right UT, lower Rt cervical paraspinals      Special Tests    Special Tests  Cervical    Cervical Tests  Spurling's;Dictraction      Spurling's   Findings  Positive    Side  Right    Comment  neg left      Distraction Test   Comment  some relief with distraction/Compression positive for pain                 Objective measurements completed on examination: See above findings.      [facility] Adult PT Treatment/Exercise - 01/10/19 0001      Exercises   Exercises  Neck      Neck Exercises: Seated   Neck Retraction  5 reps;5 secs      Neck Exercises: Supine   Neck Retraction  5 reps;5 secs      Modalities   Modalities  Traction      Traction   Type of Traction  Cervical    Min (lbs)  5    Max (lbs)  12    Hold Time  60    Rest Time  20    Time  10             PT Education - 01/10/19 0837    Education Details  HEP    Person(s) Educated  Patient    Methods  Explanation;Demonstration;Handout    Comprehension  Verbalized understanding          PT Long Term Goals - 01/10/19 1348      PT LONG TERM GOAL #1   Title  Patient ind with HEP    Time  6    Period  Weeks    Status  New    Target Date  02/21/19      PT LONG TERM GOAL #2   Title  Patient to report no pain in neck and arm with ADLs.    Time  6    Period  Weeks    Status  New      PT LONG TERM GOAL #3   Title  Patient to demo 5/5 RUE strength and Rt grip strength of 100# or better    Time  6    Period  Weeks    Status  New  PT LONG TERM GOAL #4   Title  Patient able to drive without pain.    Time  6    Period  Weeks    Status  New             Plan - 01/10/19 9678    Clinical Impression Statement  Patient presents with signs and symptoms consistent with a cervical disc derangement. He has pain and numbness into RUE as well as weakness affecting ADLS and his job. He also has significant difference in grip strength in his dominant hand vs. left hand. He has pain with cervical motion as well. He responded well to intermittent traction today with relief during pull. He may do well with static traction next visit. He is scheduled for MRI today. Patient will benefit from PT to address these deficits and return him to PLOF.    Personal Factors and Comorbidities  Comorbidity 1     Comorbidities  DM    Stability/Clinical Decision Making  Stable/Uncomplicated    Clinical Decision Making  Low    Rehab Potential  Good    PT Frequency  2x / week    PT Duration  6 weeks    PT Treatment/Interventions  ADLs/Self Care Home Management;Cryotherapy;Electrical Stimulation;Moist Heat;Traction;Ultrasound;Therapeutic exercise;Neuromuscular re-education;Patient/family education;Manual techniques;Dry needling    PT Next Visit Plan  assess traction and try static; cervical retraction and extension biased TE; UE and grip strength; manual prn    PT Home Exercise Plan  [place] and Agree with Plan of Care  Patient       Patient will benefit from skilled therapeutic intervention in order to improve the following deficits and impairments:  Decreased range of motion, Impaired UE functional use, Pain, Decreased strength, Postural dysfunction  Visit Diagnosis: Right cervical radiculopathy - Plan: PT plan of care cert/re-cert  Muscle weakness (generalized) - Plan: PT plan of care cert/re-cert     Problem List Patient Active Problem List   Diagnosis Date Noted  . Right cervical radiculopathy 01/06/2019  . Controlled type 2 diabetes mellitus without complication, without long-term current use of insulin ([place]) 08/29/2018  . Benign essential hypertension 08/29/2018  . Mass of wrist, left 10/12/2017  . Overweight (BMI 25.0-29.9) 09/22/2016  . Skin lesion of hand 09/22/2016  . Erectile dysfunction 09/22/2016  . Lumbar degenerative disc disease 06/20/2016  . Hypertriglyceridemia 01/28/2016  . Annual physical exam 01/27/2016  . Corn 01/27/2016  . Hallux valgus, left 01/27/2016    [name] PT 01/10/2019, 1:53 PM  [facility] [address] [place], [place], [address] Phone: [phone]   Fax:  [phone]  Name: Carl Griffith MRN: [id number] Date of Birth: 02/12/1975

## 2019-01-13 ENCOUNTER — Telehealth: Payer: Self-pay

## 2019-01-13 NOTE — Telephone Encounter (Signed)
Patient advised of results and recommendations.  

## 2019-01-13 NOTE — Telephone Encounter (Signed)
Patient's wife called about MRI results.   [phone]  MR SPINE CERVICAL WO CONTRAST12/05/2018 [facility] Result Impression   1. At C6-C7, a posterior disc osteophyte complex with superimposed central and left eccentric disc herniation contacts the cord with moderate central stenosis. There is narrowing of the left lateral canal with disc that may impinge on the exiting left C7 nerve roots with moderate left foraminal stenosis. Mild to moderate right foraminal stenosis. 2. Image degradation due to patient motion. No definite cord signal abnormality.  Result Narrative  MRI CERVICAL SPINE WITHOUT CONTRAST, 01/10/2019 11:34 AM  INDICATION: \ M54.12 Right cervical radiculopathy   COMPARISON: None.   TECHNIQUE: Multiplanar, multisequence surface-coil magnetic resonance imaging of the cervical spine was performed without contrast.   LEVELS IMAGED: Foramen magnum to the upper thoracic region.  FINDINGS:  Image quality is mildly degraded by patient motion.  Alignment: No substantial listhesis.  Vertebrae: No marrow signal abnormalities to suggest neoplasm.  Spinal Cord: Normal signal.   Craniocervical junction: No focal abnormality.   Degenerative changes:  C1-C2: No substantial canal or foraminal stenosis.  C2-C3: No substantial canal or foraminal stenosis.  C3-C4: No substantial canal or foraminal stenosis.  C4-C5: No substantial canal or foraminal stenosis.  Minimal disc bulge. C5-C6: No substantial canal or foraminal stenosis.  C6-C7: Disc osteophyte complex with a superimposed central and left eccentric disc herniation that deforms the ventral thecal sac and contacts the cord with moderate central stenosis. Mass effect on the lateral aspect of the canal as nerve roots exit the neural foramen with moderate left foraminal stenosis. Uncovertebral joint spurring also results in mild to moderate right foraminal stenosis.  C7-T1: No  substantial canal or foraminal stenosis.   Visualized portion of the thoracic spine: No high grade canal stenosis.   Additional Comments: None.  Other Result Information  Interface, Rad Results In - 01/10/2019  2:52 PM EST MRI CERVICAL SPINE WITHOUT CONTRAST, 01/10/2019 11:34 AM  INDICATION:  \ M54.12 Right cervical radiculopathy   COMPARISON:  None.    TECHNIQUE:  Multiplanar, multisequence surface-coil magnetic resonance imaging of the cervical spine was performed without contrast.   LEVELS IMAGED:  Foramen magnum to the upper thoracic region.  FINDINGS:  Image quality is mildly degraded by patient motion.  Alignment:  No substantial listhesis.  Vertebrae:  No marrow signal abnormalities to suggest neoplasm.  Spinal Cord:  Normal signal.    Craniocervical junction:  No focal abnormality.   Degenerative changes:  C1-C2:  No substantial canal or foraminal stenosis.  C2-C3:  No substantial canal or foraminal stenosis.  C3-C4:  No substantial canal or foraminal stenosis.  C4-C5:  No substantial canal or foraminal stenosis.   Minimal disc bulge. C5-C6:  No substantial canal or foraminal stenosis.  C6-C7: Disc osteophyte complex with a superimposed central and left eccentric disc herniation that deforms the ventral thecal sac and contacts the cord with moderate central stenosis. Mass effect on the lateral aspect of the canal as nerve roots exit the neural foramen with moderate left foraminal stenosis. Uncovertebral joint spurring also results in mild to moderate right foraminal stenosis.  C7-T1:  No substantial canal or foraminal stenosis.   Visualized portion of the thoracic spine:  No high grade canal stenosis.   Additional Comments:  None.   CONCLUSION:  1.  At C6-C7, a posterior disc osteophyte complex with superimposed central and left eccentric disc herniation contacts the cord with moderate central stenosis. There is narrowing of the left lateral  canal with disc that  may impinge on the exiting left C7 nerve roots with moderate left foraminal stenosis. Mild to moderate right foraminal stenosis. 2.  Image degradation due to patient motion. No definite cord signal abnormality.  Status Results Details

## 2019-01-13 NOTE — Telephone Encounter (Signed)
C6-C7 bulging disc, if fails conservative treatment we will proceed with an epidural

## 2019-01-14 ENCOUNTER — Encounter: Payer: Self-pay | Admitting: Physical Therapy

## 2019-01-14 ENCOUNTER — Ambulatory Visit (INDEPENDENT_AMBULATORY_CARE_PROVIDER_SITE_OTHER): Payer: BC Managed Care – PPO | Admitting: Physical Therapy

## 2019-01-14 ENCOUNTER — Other Ambulatory Visit: Payer: Self-pay

## 2019-01-14 DIAGNOSIS — M6281 Muscle weakness (generalized): Secondary | ICD-10-CM | POA: Diagnosis not present

## 2019-01-14 DIAGNOSIS — M5412 Radiculopathy, cervical region: Secondary | ICD-10-CM

## 2019-01-14 NOTE — Therapy (Signed)
[place], [place], [address] Phone: [phone]   Fax:  [phone]  Physical Therapy Treatment  Patient Details  Name: Carl Griffith MRN: [id number] Date of Birth: 1975-08-23 Referring Provider (PT): Dr. Dianah Field   Encounter Date: 01/14/2019  PT End of Session - 01/14/19 1153    Visit Number  2    Number of Visits  12    Date for PT Re-Evaluation  02/21/19    PT Start Time  1103    PT Stop Time  1145    PT Time Calculation (min)  42 min    Activity Tolerance  Patient tolerated treatment well;No increased pain    Behavior During Therapy  WFL for tasks assessed/performed       Past Medical History:  Diagnosis Date  . Hypertriglyceridemia 01/28/2016    History reviewed. No pertinent surgical history.  There were no vitals filed for this visit.  Subjective Assessment - 01/14/19 1111    Subjective  Pt reports that the pain in his Rt arm has decreased (1/10) and now only goes down to elbow. Driving continues to bother his symptoms.    Currently in Pain?  Yes    Pain Score  7     Pain Location  Neck    Pain Orientation  Mid;Right    Pain Descriptors / Indicators  Dull;Aching    Pain Radiating Towards  Rt elbow    Aggravating Factors   driving    Pain Relieving Factors  rest, meds         [facility] PT Assessment - 01/14/19 0001      Assessment   Medical Diagnosis  right cervical radiculopathy    Referring Provider (PT)  Dr. Dianah Field    Onset Date/Surgical Date  12/20/18    Hand Dominance  Right    Next MD Visit  today       [facility] Adult PT Treatment/Exercise - 01/14/19 0001      Self-Care   Self-Care  Posture;Other Self-Care Comments    Posture  Pt educated on importance of posture, avoiding c curve and forward head posture, keeping lumbar curve; pt verbalized understanding    Other Self-Care Comments   Discussed use of TENS unit at home to assist with pain control; pt  verbalized understanding.       Neck Exercises: Seated   Neck Retraction  5 reps;3 secs    W Back  5 reps   5 sec    Other Seated Exercise  scap retraction x 5 sec x 5 reps     Other Seated Exercise  thoracic ext over back of chair with head supported x 3 reps x 5 sec       Traction   Type of Traction  Cervical    Min (lbs)  8    Max (lbs)  15    Hold Time  60    Rest Time  20    Time  12      Neck Exercises: Stretches   Upper Trapezius Stretch  Right;Left;1 rep;10 seconds   trial; increased pain both directions; stopped   Other Neck Stretches  low and midleve doorway stretch x 20 sec x 2 reps each     Other Neck Stretches  trial of Rt/Lt tricep stretch x 15 sec (minimal stretch felt)             PT Education - 01/14/19 1121    Education Details  HEP, TENS  info    Person(s) Educated  Patient    Methods  Explanation;Demonstration;Verbal cues;Handout    Comprehension  Verbalized understanding;Returned demonstration          PT Long Term Goals - 01/10/19 1348      PT LONG TERM GOAL #1   Title  Patient ind with HEP    Time  6    Period  Weeks    Status  New    Target Date  02/21/19      PT LONG TERM GOAL #2   Title  Patient to report no pain in neck and arm with ADLs.    Time  6    Period  Weeks    Status  New      PT LONG TERM GOAL #3   Title  Patient to demo 5/5 RUE strength and Rt grip strength of 100# or better    Time  6    Period  Weeks    Status  New      PT LONG TERM GOAL #4   Title  Patient able to drive without pain.    Time  6    Period  Weeks    Status  New            Plan - 01/14/19 1114    Clinical Impression Statement  Pt observed in waiting area with foreward head posture looking at phone; encouraged pt to be mindful of postures and educated him on how posture affects symptoms in neck/RUE.  Positive response to traction last visit; repeated with increase in pull. Pt reported reduction of symptoms during traction, however they  returned at end of session. Goals are ongoing.    Personal Factors and Comorbidities  Comorbidity 1    Comorbidities  DM    Stability/Clinical Decision Making  Stable/Uncomplicated    Rehab Potential  Good    PT Frequency  2x / week    PT Duration  6 weeks    PT Treatment/Interventions  ADLs/Self Care Home Management;Cryotherapy;Electrical Stimulation;Moist Heat;Traction;Ultrasound;Therapeutic exercise;Neuromuscular re-education;Patient/family education;Manual techniques;Dry needling    PT Next Visit Plan  assess traction and try static; cervical retraction and extension biased TE; posture education and strengthening.    PT Home Exercise Plan  [facility]    Consulted and Agree with Plan of Care  Patient       Patient will benefit from skilled therapeutic intervention in order to improve the following deficits and impairments:  Decreased range of motion, Impaired UE functional use, Pain, Decreased strength, Postural dysfunction  Visit Diagnosis: Right cervical radiculopathy  Muscle weakness (generalized)     Problem List Patient Active Problem List   Diagnosis Date Noted  . Right cervical radiculopathy 01/06/2019  . Controlled type 2 diabetes mellitus without complication, without long-term current use of insulin ([facility]) 08/29/2018  . Benign essential hypertension 08/29/2018  . Mass of wrist, left 10/12/2017  . Overweight (BMI 25.0-29.9) 09/22/2016  . Skin lesion of hand 09/22/2016  . Erectile dysfunction 09/22/2016  . Lumbar degenerative disc disease 06/20/2016  . Hypertriglyceridemia 01/28/2016  . Annual physical exam 01/27/2016  . Corn 01/27/2016  . Hallux valgus, left 01/27/2016   Mayer Camel, PTA 01/14/19 12:14 PM  [facility] [place] [address] [place], [place], [address] Phone: [phone]   Fax:  [phone]  Name: Carl Griffith MRN: [id number] Date of Birth: 1975/12/05

## 2019-01-14 NOTE — Patient Instructions (Addendum)
TENS UNIT: This is helpful for muscle pain and spasm.   Search and Purchase a TENS 7000 2nd edition at [email or web address] It should be less than $30.     TENS unit instructions: Do not shower or bathe with the unit on Turn the unit off before removing electrodes or batteries If the electrodes lose stickiness add a drop of water to the electrodes after they are disconnected from the unit and place on plastic sheet. If you continued to have difficulty, call the TENS unit company to purchase more electrodes. Do not apply lotion on the skin area prior to use. Make sure the skin is clean and dry as this will help prolong the life of the electrodes. After use, always check skin for unusual red areas, rash or other skin difficulties. If there are any skin problems, does not apply electrodes to the same area. Never remove the electrodes from the unit by pulling the wires. Do not use the TENS unit or electrodes other than as directed. Do not change electrode placement without consultating your therapist or physician. Keep 2 fingers with between each electrode. Wear time ratio is 2:1, on to off times.    For example on for 30 minutes off for 15 minutes and then on for 30 minutes off for 15 minutes   Access Code: [email or web address]: [email or web address]  Date: 01/14/2019  Prepared by: Kerin Perna   Exercises  Seated Cervical Retraction - 10 reps - 1 sets - 3-5 sec hold - 4x daily - 7x weekly  Supine Chin Tuck - 10 reps - 1 sets - 3-5 hold - 4x daily - 7x weekly  Seated Scapular Retraction - 5 reps - 1 sets - 5-10 seconds hold - 3x daily - 7x weekly  W's - this is a stick up position - 5 reps - 1 sets - 5 seconds hold - 2x daily - 7x weekly  Neck Sidebend Stretch - 2-3 reps - 1 sets - 15 hold - 2x daily - 7x weekly  Patient Education  Forward Head Posture  Office Posture

## 2019-01-15 ENCOUNTER — Ambulatory Visit (INDEPENDENT_AMBULATORY_CARE_PROVIDER_SITE_OTHER): Payer: BC Managed Care – PPO | Admitting: Physical Therapy

## 2019-01-15 ENCOUNTER — Telehealth: Payer: Self-pay | Admitting: Sports Medicine

## 2019-01-15 DIAGNOSIS — M6281 Muscle weakness (generalized): Secondary | ICD-10-CM

## 2019-01-15 DIAGNOSIS — M5412 Radiculopathy, cervical region: Secondary | ICD-10-CM

## 2019-01-15 NOTE — Telephone Encounter (Signed)
Letter updated and patient will pull off of mychart. No other questions.

## 2019-01-15 NOTE — Telephone Encounter (Signed)
Okay to go ahead and extend his note to however long he wants, and no appointment needed.

## 2019-01-15 NOTE — Therapy (Signed)
[place], [place], [address] Phone: [phone]   Fax:  [phone]  Physical Therapy Treatment  Patient Details  Name: Carl Griffith MRN: [id number] Date of Birth: 07-28-1975 Referring Provider (PT): Dr. Dianah Field   Encounter Date: 01/15/2019  PT End of Session - 01/15/19 1156    Visit Number  3    Number of Visits  12    Date for PT Re-Evaluation  02/21/19    PT Start Time  2924    PT Stop Time  4628    PT Time Calculation (min)  49 min    Activity Tolerance  Patient tolerated treatment well;No increased pain    Behavior During Therapy  WFL for tasks assessed/performed       Past Medical History:  Diagnosis Date  . Hypertriglyceridemia 01/28/2016    No past surgical history on file.  There were no vitals filed for this visit.  Subjective Assessment - 01/15/19 1156    Subjective  It's a little better. No pain in arm, just in neck    Currently in Pain?  Yes    Pain Score  3     Pain Location  Neck    Pain Orientation  Right;Mid    Pain Descriptors / Indicators  Aching;Dull                       [facility] Adult PT Treatment/Exercise - 01/15/19 0001      Self-Care   Other Self-Care Comments   discussed RTW and  that we should attempt higher level strengthening before that decision is made      Neck Exercises: Standing   Neck Retraction  5 secs;20 reps   2nd set with red band resistance   Neck Retraction Limitations  with shoulders against wall    Other Standing Exercises  on noodle: Ws x 5, green band horiz ABD,  left D2 ext x 10 ea      Neck Exercises: Seated   Neck Retraction  5 reps   with extension   Neck Retraction Limitations  increased pain in neck/scapula      Neck Exercises: Prone   Neck Retraction  5 reps    W Back Limitations  attempted but too painful      Traction   Type of Traction  Cervical    Min (lbs)  10    Max (lbs)  15    Hold Time   60    Rest Time  20    Time  12      Manual Therapy   Manual Therapy  Joint mobilization    Joint Mobilization  thoracic and lower cervical PA mobs Gd II/III; pain on right from C5 to T2 at level pushing      Neck Exercises: Stretches   Upper Trapezius Stretch  Left;2 reps;30 seconds   with chin tuck   Other Neck Stretches  --                  PT Long Term Goals - 01/15/19 1243      PT LONG TERM GOAL #1   Title  Patient ind with HEP    Status  Partially Met      PT LONG TERM GOAL #2   Title  Patient to report no pain in neck and arm with ADLs.    Status  On-going      PT LONG TERM GOAL #4  Title  Patient able to drive without pain.    Status  On-going            Plan - 01/15/19 1244    Clinical Impression Statement  Patient's symptoms are starting to centralize. He reports no pain in his upper arm today. He cannot tolerated retraction with extension however. He tolerated increased resistance to neck retraction without pain. Patient is anxious to return to work and would benefit from work simulation activiites to assess tolerance.    PT Frequency  2x / week    PT Duration  6 weeks    PT Treatment/Interventions  ADLs/Self Care Home Management;Cryotherapy;Electrical Stimulation;Moist Heat;Traction;Ultrasound;Therapeutic exercise;Neuromuscular re-education;Patient/family education;Manual techniques;Dry needling    PT Next Visit Plan  Try work simulation activities. continue with traction;  cervical retraction and extension biased TE; posture education and strengthening.    PT Home Exercise Plan  [place] and Agree with Plan of Care  Patient       Patient will benefit from skilled therapeutic intervention in order to improve the following deficits and impairments:  Decreased range of motion, Impaired UE functional use, Pain, Decreased strength, Postural dysfunction  Visit Diagnosis: Right cervical radiculopathy  Muscle weakness  (generalized)     Problem List Patient Active Problem List   Diagnosis Date Noted  . Right cervical radiculopathy 01/06/2019  . Controlled type 2 diabetes mellitus without complication, without long-term current use of insulin ([place]) 08/29/2018  . Benign essential hypertension 08/29/2018  . Mass of wrist, left 10/12/2017  . Overweight (BMI 25.0-29.9) 09/22/2016  . Skin lesion of hand 09/22/2016  . Erectile dysfunction 09/22/2016  . Lumbar degenerative disc disease 06/20/2016  . Hypertriglyceridemia 01/28/2016  . Annual physical exam 01/27/2016  . Corn 01/27/2016  . Hallux valgus, left 01/27/2016    [name] PT 01/15/2019, 2:21 PM  [facility] [place], [place], [address] Phone: [phone]   Fax:  [phone]  Name: Willy Pinkerton MRN: [id number] Date of Birth: 07-21-1975

## 2019-01-15 NOTE — Telephone Encounter (Signed)
Patient wife called and he is doing better with the Physical therapy. His note is set to expire this Friday. Per Physical Therapy they recommend the weight restrictions be extended so the patient does not potentially get a bulging disc. Is it ok to extend the note and does he need a follow up for the note extension? Please advise.

## 2019-01-17 ENCOUNTER — Encounter: Payer: Self-pay | Admitting: Sports Medicine

## 2019-01-17 ENCOUNTER — Other Ambulatory Visit: Payer: Self-pay

## 2019-01-17 ENCOUNTER — Ambulatory Visit (INDEPENDENT_AMBULATORY_CARE_PROVIDER_SITE_OTHER): Payer: BC Managed Care – PPO | Admitting: Sports Medicine

## 2019-01-17 DIAGNOSIS — M5412 Radiculopathy, cervical region: Secondary | ICD-10-CM

## 2019-01-17 NOTE — Assessment & Plan Note (Signed)
Right C7 distribution radiculitis, improving considerably with formal PT, steroids. 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

## 2019-01-17 NOTE — Progress Notes (Signed)
Subjective:    CC: Follow-up  HPI: Ko needs some more paperwork filled out, we are needing to extend his disability out of work.  He is not completely better yet.  Needs a few more weeks of physical therapy before considering an epidural.  He is however improving, radicular symptoms have improved, only has axial neck pain.  I reviewed the past medical history, family history, social history, surgical history, and allergies today and no changes were needed.  Please see the problem list section below in epic for further details.  Past Medical History: Past Medical History:  Diagnosis Date  . Hypertriglyceridemia 01/28/2016   Past Surgical History: No past surgical history on file. Social History: Social History   Socioeconomic History  . Marital status: Married    Spouse name: Not on file  . Number of children: Not on file  . Years of education: Not on file  . Highest education level: Not on file  Occupational History  . Not on file  Tobacco Use  . Smoking status: Former Smoker    Packs/day: 1.50    Years: 15.00    Pack years: 22.50    Quit date: 07/23/2016    Years since quitting: 2.4  . Smokeless tobacco: Never Used  Substance and Sexual Activity  . Alcohol use: No  . Drug use: No  . Sexual activity: Yes    Partners: Female    Birth control/protection: None  Other Topics Concern  . Not on file  Social History Narrative  . Not on file   Social Determinants of Health   Financial Resource Strain:   . Difficulty of Paying Living Expenses: Not on file  Food Insecurity:   . Worried About Charity fundraiser in the Last Year: Not on file  . Ran Out of Food in the Last Year: Not on file  Transportation Needs:   . Lack of Transportation (Medical): Not on file  . Lack of Transportation (Non-Medical): Not on file  Physical Activity:   . Days of Exercise per Week: Not on file  . Minutes of Exercise per Session: Not on file  Stress:   . Feeling of Stress : Not on  file  Social Connections:   . Frequency of Communication with Friends and Family: Not on file  . Frequency of Social Gatherings with Friends and Family: Not on file  . Attends Religious Services: Not on file  . Active Member of Clubs or Organizations: Not on file  . Attends Archivist Meetings: Not on file  . Marital Status: Not on file   Family History: Family History  Problem Relation Age of Onset  . Diabetes Mother   . Hypertension Mother   . Heart attack Mother   . Cancer Maternal Grandmother   . Hypertension Maternal Grandmother   . Diabetes Maternal Grandmother   . Heart attack Maternal Grandmother   . Hypertension Maternal Grandfather   . Heart attack Maternal Grandfather    Allergies: No Known Allergies Medications: See med rec.  Review of Systems: No fevers, chills, night sweats, weight loss, chest pain, or shortness of breath.   Objective:    General: Well Developed, well nourished, and in no acute distress.  Neuro: Alert and oriented x3, extra-ocular muscles intact, sensation grossly intact.  HEENT: Normocephalic, atraumatic, pupils equal round reactive to light, neck supple, no masses, no lymphadenopathy, thyroid nonpalpable.  Skin: Warm and dry, no rashes. Cardiac: Regular rate and rhythm, no murmurs rubs or gallops, no lower  extremity edema.  Respiratory: Clear to auscultation bilaterally. Not using accessory muscles, speaking in full sentences.   Impression and Recommendations:    Right cervical radiculopathy Right C7 distribution radiculitis, improving considerably with formal PT, steroids. 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    ___________________________________________ [name]. [name], M.D., ABFM., CAQSM. Primary Care and Sports  Medicine [facility]  Adjunct Professor of Family Medicine  [employer]

## 2019-01-21 ENCOUNTER — Ambulatory Visit (INDEPENDENT_AMBULATORY_CARE_PROVIDER_SITE_OTHER): Payer: BC Managed Care – PPO | Admitting: Physical Therapy

## 2019-01-21 ENCOUNTER — Other Ambulatory Visit: Payer: Self-pay

## 2019-01-21 ENCOUNTER — Encounter: Payer: Self-pay | Admitting: Physical Therapy

## 2019-01-21 DIAGNOSIS — M5412 Radiculopathy, cervical region: Secondary | ICD-10-CM

## 2019-01-21 DIAGNOSIS — M6281 Muscle weakness (generalized): Secondary | ICD-10-CM | POA: Diagnosis not present

## 2019-01-21 NOTE — Therapy (Signed)
[place], [place], [address] Phone: [phone]   Fax:  [phone]  Physical Therapy Treatment  Patient Details  Name: Carl Griffith MRN: [id number] Date of Birth: Oct 28, 1975 Referring Provider (PT): Dr. Dianah Field   Encounter Date: 01/21/2019  PT End of Session - 01/21/19 0801    Visit Number  4    Number of Visits  12    Date for PT Re-Evaluation  02/21/19    PT Start Time  0800    PT Stop Time  9476    PT Time Calculation (min)  52 min    Activity Tolerance  Patient tolerated treatment well;No increased pain    Behavior During Therapy  WFL for tasks assessed/performed       Past Medical History:  Diagnosis Date  . Hypertriglyceridemia 01/28/2016    History reviewed. No pertinent surgical history.  There were no vitals filed for this visit.  Subjective Assessment - 01/21/19 0801    Subjective  Pain is back in my arm again. Yesterday was the worst. Tingling into digits 4 and 5. Pain in the arm. Can abolish sx putting his right hand on his head.    Pertinent History  DM    Patient Stated Goals  get rid of his sx in arm.    Currently in Pain?  Yes    Pain Score  5     Pain Location  Neck    Pain Orientation  Right;Mid    Pain Descriptors / Indicators  Aching    Pain Type  Acute pain    Pain Radiating Towards  to forearm and constant in elbow    Pain Onset  1 to 4 weeks ago                       [facility] Adult PT Treatment/Exercise - 01/21/19 0001      Exercises   Exercises  Neck      Neck Exercises: Supine   Neck Retraction  10 reps;5 secs;20 reps    Neck Retraction Limitations  set 1: centralizes to neck 5/10; set 2: pain 3/10; set 3: no pain in neck, some pain 1/10 in right shoulder    Other Supine Exercise  decompression head and one heel; bil 3 sec hold x 5 each leg      Traction   Type of Traction  Cervical    Min (lbs)  15    Max (lbs)  15    Time   12      Manual Therapy   Manual Therapy  Soft tissue mobilization    Soft tissue mobilization  to right UT and cervical paraspinals      Neck Exercises: Stretches   Chest Stretch  2 reps;60 seconds   supine T and Y bil      Trigger Point Dry Needling - 01/21/19 0001    Consent Given?  Yes    Education Handout Provided  Previously provided    Muscles Treated Head and Neck  Upper trapezius;Cervical multifidi    Upper Trapezius Response  Twitch reponse elicited;Palpable increased muscle length    Cervical multifidi Response  Twitch reponse elicited;Palpable increased muscle length                PT Long Term Goals - 01/15/19 1243      PT LONG TERM GOAL #1   Title  Patient ind with HEP    Status  Partially  Met      PT LONG TERM GOAL #2   Title  Patient to report no pain in neck and arm with ADLs.    Status  On-going      PT LONG TERM GOAL #4   Title  Patient able to drive without pain.    Status  On-going            Plan - 01/21/19 3614    Clinical Impression Statement  Pt with increased sx into RUE today x 2-3 days. He was able to centralize sx with supine chin tucks, but sx return within 30 seconds of sitting up. He responded very well to DN in cervical spine and we did a trial of static traction today. He will try supine chin tucks x 30 every 2 hours at home beginning today. Dr. Darene Lamer wants him to try 6 more weeks of therapy and if not successful wlll try injection.    PT Frequency  2x / week    PT Duration  6 weeks    PT Treatment/Interventions  ADLs/Self Care Home Management;Cryotherapy;Electrical Stimulation;Moist Heat;Traction;Ultrasound;Therapeutic exercise;Neuromuscular re-education;Patient/family education;Manual techniques;Dry needling    PT Next Visit Plan  assess supine chin tucks, static traction and DN. Continue as indicated and with cervical stab.    PT Home Exercise Plan  [facility]       Patient will benefit from skilled therapeutic intervention  in order to improve the following deficits and impairments:  Decreased range of motion, Impaired UE functional use, Pain, Decreased strength, Postural dysfunction  Visit Diagnosis: Right cervical radiculopathy  Muscle weakness (generalized)     Problem List Patient Active Problem List   Diagnosis Date Noted  . Right cervical radiculopathy 01/06/2019  . Controlled type 2 diabetes mellitus without complication, without long-term current use of insulin ([place]) 08/29/2018  . Benign essential hypertension 08/29/2018  . Mass of wrist, left 10/12/2017  . Overweight (BMI 25.0-29.9) 09/22/2016  . Skin lesion of hand 09/22/2016  . Erectile dysfunction 09/22/2016  . Lumbar degenerative disc disease 06/20/2016  . Hypertriglyceridemia 01/28/2016  . Annual physical exam 01/27/2016  . Corn 01/27/2016  . Hallux valgus, left 01/27/2016    [name] PT 01/21/2019, 10:45 AM  [facility] [place], [place], [address] Phone: [phone]   Fax:  [phone]  Name: Carl Griffith MRN: [id number] Date of Birth: [birth year]

## 2019-01-24 ENCOUNTER — Ambulatory Visit (INDEPENDENT_AMBULATORY_CARE_PROVIDER_SITE_OTHER): Payer: BC Managed Care – PPO | Admitting: Physical Therapy

## 2019-01-24 ENCOUNTER — Other Ambulatory Visit: Payer: Self-pay

## 2019-01-24 DIAGNOSIS — M5412 Radiculopathy, cervical region: Secondary | ICD-10-CM | POA: Diagnosis not present

## 2019-01-24 DIAGNOSIS — M6281 Muscle weakness (generalized): Secondary | ICD-10-CM

## 2019-01-24 NOTE — Therapy (Signed)
[place], [place], [address] Phone: [phone]   Fax:  [phone]  Physical Therapy Treatment  Patient Details  Name: Carl Griffith MRN: [id number] Date of Birth: 1975-12-17 Referring Provider (PT): Dr. Dianah Field   Encounter Date: 01/24/2019  PT End of Session - 01/24/19 0807    Visit Number  5    Number of Visits  12    Date for PT Re-Evaluation  02/21/19    PT Start Time  0804    PT Stop Time  0845    PT Time Calculation (min)  41 min    Activity Tolerance  Patient tolerated treatment well;No increased pain    Behavior During Therapy  WFL for tasks assessed/performed       Past Medical History:  Diagnosis Date  . Hypertriglyceridemia 01/28/2016    No past surgical history on file.  There were no vitals filed for this visit.  Subjective Assessment - 01/24/19 0808    Subjective  Pt reports the traction has helped; didn't notice a difference between static and intermittent traction.  Pain continues to be bothersome in Rt forearm and tricep.    Currently in Pain?  Yes    Pain Score  4     Pain Location  Neck    Pain Orientation  Right;Mid    Pain Descriptors / Indicators  Aching    Pain Radiating Towards  to Rt forearm    Aggravating Factors   driving, forward head    Pain Relieving Factors  laying down, rest.         [facility] PT Assessment - 01/24/19 0001      Assessment   Medical Diagnosis  right cervical radiculopathy    Referring Provider (PT)  Dr. Dianah Field    Onset Date/Surgical Date  12/20/18    Hand Dominance  Right    Next MD Visit  today      AROM   Cervical Flexion  28   pain    Cervical - Right Side Bend  20   pain   Cervical - Left Side Bend  25   pain        [facility] Adult PT Treatment/Exercise - 01/24/19 0001      Traction   Min (lbs)  10    Max (lbs)  15    Hold Time  60    Rest Time  20    Time  19      Manual Therapy   Soft tissue  mobilization  STM to Rt forearm to decrease fascial tightness.       Neck Exercises: Stretches   Other Neck Stretches  3 position doorway stretch x 15 sec x 2 reps each;  Rt tricep stretch with wall assist x 30 sec;  Rt wrist flexor stretch x 15 sec; Rt wrist flexor/supinator stretch x 15 sec x 2 reps         PT Long Term Goals - 01/24/19 0843      PT LONG TERM GOAL #1   Title  Patient ind with HEP    Time  6    Period  Weeks    Status  Partially Met      PT LONG TERM GOAL #2   Title  Patient to report no pain in neck and arm with ADLs.    Time  6    Period  Weeks    Status  On-going      PT LONG  TERM GOAL #3   Title  Patient to demo 5/5 RUE strength and Rt grip strength of 100# or better    Time  6    Period  Weeks    Status  On-going      PT LONG TERM GOAL #4   Title  Patient able to drive without pain.    Time  6    Period  Weeks    Status  On-going            Plan - 01/24/19 8867    Clinical Impression Statement  Pt with continued limited cervical ROM and radicular pain into R forearm.  Encouraged pt to have wife assist with STM to Rt scalenes and forearm, continue use of TENS and decompression position, and ice neck 2x/day to decrease irritation. Positive response to traction, regardless of static or intermittent pull.   Goals are ongoing.    PT Frequency  2x / week    PT Duration  6 weeks    PT Treatment/Interventions  ADLs/Self Care Home Management;Cryotherapy;Electrical Stimulation;Moist Heat;Traction;Ultrasound;Therapeutic exercise;Neuromuscular re-education;Patient/family education;Manual techniques;Dry needling    PT Next Visit Plan  Continue traction and cervical stabilization. manual therapy to Rt scalenes.    PT Home Exercise Plan  [facility]       Patient will benefit from skilled therapeutic intervention in order to improve the following deficits and impairments:  Decreased range of motion, Impaired UE functional use, Pain, Decreased strength, Postural  dysfunction  Visit Diagnosis: Right cervical radiculopathy  Muscle weakness (generalized)     Problem List Patient Active Problem List   Diagnosis Date Noted  . Right cervical radiculopathy 01/06/2019  . Controlled type 2 diabetes mellitus without complication, without long-term current use of insulin ([place]) 08/29/2018  . Benign essential hypertension 08/29/2018  . Mass of wrist, left 10/12/2017  . Overweight (BMI 25.0-29.9) 09/22/2016  . Skin lesion of hand 09/22/2016  . Erectile dysfunction 09/22/2016  . Lumbar degenerative disc disease 06/20/2016  . Hypertriglyceridemia 01/28/2016  . Annual physical exam 01/27/2016  . Corn 01/27/2016  . Hallux valgus, left 01/27/2016   Kerin Perna, PTA 01/24/19 8:43 AM  [facility] [place], [place], [address] Phone: [phone]   Fax:  [phone]  Name: Carl Griffith MRN: [id number] Date of Birth: [birth year]

## 2019-01-27 ENCOUNTER — Encounter: Payer: Self-pay | Admitting: Physical Therapy

## 2019-01-27 ENCOUNTER — Ambulatory Visit (INDEPENDENT_AMBULATORY_CARE_PROVIDER_SITE_OTHER): Payer: BC Managed Care – PPO | Admitting: Physical Therapy

## 2019-01-27 ENCOUNTER — Other Ambulatory Visit: Payer: Self-pay

## 2019-01-27 DIAGNOSIS — M5412 Radiculopathy, cervical region: Secondary | ICD-10-CM

## 2019-01-27 DIAGNOSIS — M6281 Muscle weakness (generalized): Secondary | ICD-10-CM | POA: Diagnosis not present

## 2019-01-27 NOTE — Therapy (Signed)
[place], [place], [address] Phone: [phone]   Fax:  [phone]  Physical Therapy Treatment  Patient Details  Name: Carl Griffith MRN: [id number] Date of Birth: 09/22/1975 Referring Provider (PT): Dr. Dianah Field   Encounter Date: 01/27/2019  PT End of Session - 01/27/19 0746    Visit Number  6    Number of Visits  12    Date for PT Re-Evaluation  02/21/19    PT Start Time  0718    PT Stop Time  0759    PT Time Calculation (min)  41 min    Activity Tolerance  Patient tolerated treatment well;No increased pain    Behavior During Therapy  WFL for tasks assessed/performed       Past Medical History:  Diagnosis Date  . Hypertriglyceridemia 01/28/2016    History reviewed. No pertinent surgical history.  There were no vitals filed for this visit.  Subjective Assessment - 01/27/19 0742    Subjective  Pt reports increased tightness in neck, but no radicular symptoms in arm this morning.  He states he used ice this weekend, "it felt good".    Patient Stated Goals  get rid of his sx in arm.    Currently in Pain?  Yes    Pain Score  5     Pain Location  Neck    Pain Orientation  Right;Lower;Left    Pain Descriptors / Indicators  Tightness;Aching    Aggravating Factors   driving, forward head    Pain Relieving Factors  laying dow, rest, TENS         [facility] PT Assessment - 01/27/19 0001      Assessment   Medical Diagnosis  right cervical radiculopathy    Referring Provider (PT)  Dr. Dianah Field    Onset Date/Surgical Date  12/20/18    Hand Dominance  Right    Next MD Visit  today       [facility] Adult PT Treatment/Exercise - 01/27/19 0001      Neck Exercises: Standing   Other Standing Exercises  scap retraction with resisted bilat shoulder ext x 10, bilat row x 10, bilat shoulder ER x 10 (ER and ext with yellow, row with green).         Traction   Type of Traction  Cervical     Min (lbs)  10    Max (lbs)  15    Hold Time  60    Rest Time  20    Time  19      Manual Therapy   Soft tissue mobilization  IASTM to bilat cervical paraspinals, upper trap, levator and scalenes to decrease fascial restrictions and improve ROM.       Neck Exercises: Stretches   Other Neck Stretches  midlevel doorway stretch x 2 reps of 15 sec              PT Education - 01/27/19 0746    Education Details  HEP    Person(s) Educated  Patient    Methods  Explanation;Demonstration;Verbal cues;Handout    Comprehension  Verbalized understanding;Returned demonstration          PT Long Term Goals - 01/24/19 0843      PT LONG TERM GOAL #1   Title  Patient ind with HEP    Time  6    Period  Weeks    Status  Partially Met      PT LONG TERM GOAL #  2   Title  Patient to report no pain in neck and arm with ADLs.    Time  6    Period  Weeks    Status  On-going      PT LONG TERM GOAL #3   Title  Patient to demo 5/5 RUE strength and Rt grip strength of 100# or better    Time  6    Period  Weeks    Status  On-going      PT LONG TERM GOAL #4   Title  Patient able to drive without pain.    Time  6    Period  Weeks    Status  On-going            Plan - 01/27/19 0744    Clinical Impression Statement  Pt presents with decreased cervical motion and pain in neck; much improved following IASTM.  Pain decreased to 3/10 after IASTM.  Pt tolerated light resistance postural exercises; added to HEP.    Pt gradually progressing towards goals.    Rehab Potential  Good    PT Frequency  2x / week    PT Duration  6 weeks    PT Treatment/Interventions  ADLs/Self Care Home Management;Cryotherapy;Electrical Stimulation;Moist Heat;Traction;Ultrasound;Therapeutic exercise;Neuromuscular re-education;Patient/family education;Manual techniques;Dry needling    PT Next Visit Plan  Continue traction and cervical stabilization. manual therapy to Rt scalenes.    PT Home Exercise Plan  [facility]        Patient will benefit from skilled therapeutic intervention in order to improve the following deficits and impairments:  Decreased range of motion, Impaired UE functional use, Pain, Decreased strength, Postural dysfunction  Visit Diagnosis: Right cervical radiculopathy  Muscle weakness (generalized)     Problem List Patient Active Problem List   Diagnosis Date Noted  . Right cervical radiculopathy 01/06/2019  . Controlled type 2 diabetes mellitus without complication, without long-term current use of insulin ([place]) 08/29/2018  . Benign essential hypertension 08/29/2018  . Mass of wrist, left 10/12/2017  . Overweight (BMI 25.0-29.9) 09/22/2016  . Skin lesion of hand 09/22/2016  . Erectile dysfunction 09/22/2016  . Lumbar degenerative disc disease 06/20/2016  . Hypertriglyceridemia 01/28/2016  . Annual physical exam 01/27/2016  . Corn 01/27/2016  . Hallux valgus, left 01/27/2016   Kerin Perna, PTA 01/27/19 7:47 AM  [facility] [place] [address], [place], [address] Phone: [phone]   Fax:  [phone]  Name: Tabb Croghan MRN: [id number] Date of Birth: 01-11-1976

## 2019-01-27 NOTE — Patient Instructions (Signed)
Resisted External Rotation: in Neutral - Bilateral  PALMS UP!!! Sit or stand, tubing in both hands, elbows at sides, bent to 90, forearms forward. Pinch shoulder blades together and rotate forearms out. Keep elbows at sides. Repeat __10__ times per set. Do __2-3__ sets per session. Do __3-4__ sessions per week.  Resistive Band Rowing   With resistive band anchored in door, grasp both ends. Keeping elbows bent, pull back, squeezing shoulder blades together. Hold _3-5___ seconds. Repeat _10-30___ times. Do __1__ sessions per day.   Strengthening: Resisted Extension   Hold tubing with both hands, arms forward. Pull arms back, elbow straight. Repeat _10-30___ times per set. Do ____ sets per session. Do _1___ sessions per day.   

## 2019-01-29 ENCOUNTER — Other Ambulatory Visit: Payer: Self-pay

## 2019-01-29 ENCOUNTER — Ambulatory Visit (INDEPENDENT_AMBULATORY_CARE_PROVIDER_SITE_OTHER): Payer: BC Managed Care – PPO | Admitting: Physical Therapy

## 2019-01-29 DIAGNOSIS — M6281 Muscle weakness (generalized): Secondary | ICD-10-CM | POA: Diagnosis not present

## 2019-01-29 DIAGNOSIS — M5412 Radiculopathy, cervical region: Secondary | ICD-10-CM

## 2019-01-29 NOTE — Therapy (Signed)
[facility] [place], [place], [address] Phone: [phone]   Fax:  [phone]  Physical Therapy Treatment  Patient Details  Name: Carl Griffith MRN: [id number] Date of Birth: 1975/10/11 Referring Provider (PT): Dr. Dianah Field   Encounter Date: 01/29/2019  PT End of Session - 01/29/19 0902    Visit Number  7    Number of Visits  12    Date for PT Re-Evaluation  02/21/19    PT Start Time  0717    PT Stop Time  0805    PT Time Calculation (min)  48 min    Activity Tolerance  Patient tolerated treatment well;No increased pain    Behavior During Therapy  WFL for tasks assessed/performed       Past Medical History:  Diagnosis Date  . Hypertriglyceridemia 01/28/2016    No past surgical history on file.  There were no vitals filed for this visit.  Subjective Assessment - 01/29/19 0724    Subjective  Pt reports he had a very good day (yesterday) with minimal pain, until the evening.  His sleep has been affected.  This morning pain is at level when this occurance first started.  He voices frustration with the ups and downs of pain. He is nervous about an injection.    Patient Stated Goals  get rid of his sx in arm.    Currently in Pain?  Yes    Pain Score  8     Pain Location  Neck    Pain Orientation  Right;Left;Lower    Pain Descriptors / Indicators  Shooting    Pain Radiating Towards  into posterior Rt shoulder and forearm    Aggravating Factors   sitting straight up    Pain Relieving Factors  laying down, rest, TENS         [facility] PT Assessment - 01/29/19 0001      Assessment   Medical Diagnosis  right cervical radiculopathy    Referring Provider (PT)  Dr. Dianah Field    Onset Date/Surgical Date  12/20/18    Hand Dominance  Right    Next MD Visit  02/03/19        [facility] Adult PT Treatment/Exercise - 01/29/19 0001      Self-Care   Other Self-Care Comments   pt educated on self  massage (TPR) with theracane; pt returned demo with cues       Neck Exercises: Machines for Strengthening   UBE (Upper Arm Bike)  L1: 1 min forward/ 1 min backward, standing      Neck Exercises: Standing   Other Standing Exercises  scap retraction with resisted bilat shoulder ext x 10, bilat row x 10, bilat shoulder ER x 10 (all with yellow band).         Modalities   Modalities  Electrical Stimulation;Traction      Acupuncturist Location  bilat cervical paraspinals (C7); Rt post shoulder/lateral forearm    during traction   Electrical Stimulation Action  premod to each area    Electrical Stimulation Parameters  intensity to tolerance    Electrical Stimulation Goals  Pain      Traction   Type of Traction  Cervical    Min (lbs)  10    Max (lbs)  15    Hold Time  60    Rest Time  20    Time  19      Manual Therapy   Soft  tissue mobilization  IASTM to bilat cervical paraspinals, posterior Rt shoulder, upper trap, levator and scalenes to decrease fascial restrictions and improve ROM.       Neck Exercises: Stretches   Other Neck Stretches  midlevel doorway stretch x 2 reps of 15 sec     Other Neck Stretches  Rt tricep stretch x 15 sec x 2 reps; Rt wrist flex, pronation stretch x 15 sec x 2 reps         PT Long Term Goals - 01/24/19 0843      PT LONG TERM GOAL #1   Title  Patient ind with HEP    Time  6    Period  Weeks    Status  Partially Met      PT LONG TERM GOAL #2   Title  Patient to report no pain in neck and arm with ADLs.    Time  6    Period  Weeks    Status  On-going      PT LONG TERM GOAL #3   Title  Patient to demo 5/5 RUE strength and Rt grip strength of 100# or better    Time  6    Period  Weeks    Status  On-going      PT LONG TERM GOAL #4   Title  Patient able to drive without pain.    Time  6    Period  Weeks    Status  On-going            Plan - 01/29/19 0902    Clinical Impression Statement  Pt  presents with repeated flare of symptoms.  Relief from traction lasted 1.5 days, before pain returned.  Pt reported reduction of pain to 6/10 with IASTM/STM and complete relief of pain with traction and estim at end of session.  Pt tolerated postural strengthening exercises without increase in symptoms.  Pt has had intermittent relief with treatment thus far; goals are ongoing.    Rehab Potential  Good    PT Frequency  2x / week    PT Duration  6 weeks    PT Treatment/Interventions  ADLs/Self Care Home Management;Cryotherapy;Electrical Stimulation;Moist Heat;Traction;Ultrasound;Therapeutic exercise;Neuromuscular re-education;Patient/family education;Manual techniques;Dry needling    PT Next Visit Plan  Continue traction and cervical stabilization. manual therapy to Rt scalenes.    PT Home Exercise Plan  [facility]       Patient will benefit from skilled therapeutic intervention in order to improve the following deficits and impairments:  Decreased range of motion, Impaired UE functional use, Pain, Decreased strength, Postural dysfunction  Visit Diagnosis: Right cervical radiculopathy  Muscle weakness (generalized)     Problem List Patient Active Problem List   Diagnosis Date Noted  . Right cervical radiculopathy 01/06/2019  . Controlled type 2 diabetes mellitus without complication, without long-term current use of insulin ([place]) 08/29/2018  . Benign essential hypertension 08/29/2018  . Mass of wrist, left 10/12/2017  . Overweight (BMI 25.0-29.9) 09/22/2016  . Skin lesion of hand 09/22/2016  . Erectile dysfunction 09/22/2016  . Lumbar degenerative disc disease 06/20/2016  . Hypertriglyceridemia 01/28/2016  . Annual physical exam 01/27/2016  . Corn 01/27/2016  . Hallux valgus, left 01/27/2016   Kerin Perna, PTA 01/29/19 9:09 AM  [facility] [place], [place], [address] Phone: [phone]   Fax:   [phone]  Name: Luiz Trumpower MRN: [id number] Date of Birth: [birth year]

## 2019-02-04 ENCOUNTER — Encounter: Payer: Self-pay | Admitting: Sports Medicine

## 2019-02-04 ENCOUNTER — Ambulatory Visit (INDEPENDENT_AMBULATORY_CARE_PROVIDER_SITE_OTHER): Payer: BC Managed Care – PPO | Admitting: Sports Medicine

## 2019-02-04 ENCOUNTER — Other Ambulatory Visit: Payer: Self-pay

## 2019-02-04 DIAGNOSIS — M5412 Radiculopathy, cervical region: Secondary | ICD-10-CM

## 2019-02-04 NOTE — Progress Notes (Signed)
Subjective:    CC: Follow-up  HPI: Carl Griffith is a pleasant 43 year old male, he has known cervical DDD, at this point he has failed formal physical therapy and other conservative measures.  Pain is severe, persistent, localized with radiation down the right arm in a C7 distribution.  Present now for many months.  I reviewed the past medical history, family history, social history, surgical history, and allergies today and no changes were needed.  Please see the problem list section below in epic for further details.  Past Medical History: Past Medical History:  Diagnosis Date  . Hypertriglyceridemia 01/28/2016   Past Surgical History: No past surgical history on file. Social History: Social History   Socioeconomic History  . Marital status: Married    Spouse name: Not on file  . Number of children: Not on file  . Years of education: Not on file  . Highest education level: Not on file  Occupational History  . Not on file  Tobacco Use  . Smoking status: Former Smoker    Packs/day: 1.50    Years: 15.00    Pack years: 22.50    Quit date: 07/23/2016    Years since quitting: 2.5  . Smokeless tobacco: Never Used  Substance and Sexual Activity  . Alcohol use: No  . Drug use: No  . Sexual activity: Yes    Partners: Female    Birth control/protection: None  Other Topics Concern  . Not on file  Social History Narrative  . Not on file   Social Determinants of Health   Financial Resource Strain:   . Difficulty of Paying Living Expenses: Not on file  Food Insecurity:   . Worried About Programme researcher, broadcasting/film/video in the Last Year: Not on file  . Ran Out of Food in the Last Year: Not on file  Transportation Needs:   . Lack of Transportation (Medical): Not on file  . Lack of Transportation (Non-Medical): Not on file  Physical Activity:   . Days of Exercise per Week: Not on file  . Minutes of Exercise per Session: Not on file  Stress:   . Feeling of Stress : Not on file  Social  Connections:   . Frequency of Communication with Friends and Family: Not on file  . Frequency of Social Gatherings with Friends and Family: Not on file  . Attends Religious Services: Not on file  . Active Member of Clubs or Organizations: Not on file  . Attends Banker Meetings: Not on file  . Marital Status: Not on file   Family History: Family History  Problem Relation Age of Onset  . Diabetes Mother   . Hypertension Mother   . Heart attack Mother   . Cancer Maternal Grandmother   . Hypertension Maternal Grandmother   . Diabetes Maternal Grandmother   . Heart attack Maternal Grandmother   . Hypertension Maternal Grandfather   . Heart attack Maternal Grandfather    Allergies: No Known Allergies Medications: See med rec.  Review of Systems: No fevers, chills, night sweats, weight loss, chest pain, or shortness of breath.   Objective:    General: Well Developed, well nourished, and in no acute distress.  Neuro: Alert and oriented x3, extra-ocular muscles intact, sensation grossly intact.  HEENT: Normocephalic, atraumatic, pupils equal round reactive to light, neck supple, no masses, no lymphadenopathy, thyroid nonpalpable.  Skin: Warm and dry, no rashes. Cardiac: Regular rate and rhythm, no murmurs rubs or gallops, no lower extremity edema.  Respiratory: Clear  to auscultation bilaterally. Not using accessory muscles, speaking in full sentences.  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 Proceeding with cervical epidurals. Return to see me 1 month after his injection. Adding another note giving him an additional 3 months out of work.   ___________________________________________ Gwen Her. Dianah Field, M.D., ABFM., CAQSM. Primary Care and Sports Medicine [facility]  Adjunct Professor of [employer]

## 2019-02-04 NOTE — Assessment & Plan Note (Signed)
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 Proceeding with cervical epidurals. Return to see me 1 month after his injection. Adding another note giving him an additional 3 months out of work.

## 2019-02-06 ENCOUNTER — Ambulatory Visit (INDEPENDENT_AMBULATORY_CARE_PROVIDER_SITE_OTHER): Payer: BC Managed Care – PPO | Admitting: Rehabilitative and Restorative Service Providers"

## 2019-02-06 ENCOUNTER — Other Ambulatory Visit: Payer: Self-pay

## 2019-02-06 ENCOUNTER — Encounter: Payer: Self-pay | Admitting: Rehabilitative and Restorative Service Providers"

## 2019-02-06 DIAGNOSIS — M6281 Muscle weakness (generalized): Secondary | ICD-10-CM

## 2019-02-06 DIAGNOSIS — M5412 Radiculopathy, cervical region: Secondary | ICD-10-CM | POA: Diagnosis not present

## 2019-02-06 NOTE — Patient Instructions (Signed)
Access Code: [facility]  URL: [email or web address]  Date: 02/06/2019  Prepared by: Rudell Cobb   Exercises Supine Chin Tuck - 10 reps - 1 sets - 3-5 hold - 4x daily - 7x weekly                  Seated Scapular Retraction - 5 reps - 1 sets - 5-10 seconds hold - 3x daily - 7x weekly W's - this is a stick up position - 5 reps - 1 sets - 5 seconds hold - 2x daily - 7x weekly Sidelying Thoracic Rotation with Open Book - 10 reps - 1 sets - 2x daily - 7x weekly First Rib Mobilization with Strap - 3 reps - 1 sets - 15-20 seconds hold - 2x daily - 7x weekly Prone Press Up with Lateral Shifts - 10 reps - 1 sets - 2x daily - 7x weekly Patient Education Office Posture

## 2019-02-06 NOTE — Therapy (Signed)
[place], [place], [address] Phone: [phone]   Fax:  [phone]  Physical Therapy Treatment  Patient Details  Name: Carl Griffith MRN: [id number] Date of Birth: [birth year] Referring Provider (PT): Dr. Dianah Field   Encounter Date: 02/06/2019  PT End of Session - 02/06/19 1339    Visit Number  8    Number of Visits  12    Date for PT Re-Evaluation  02/21/19    PT Start Time  0848    PT Stop Time  0942    PT Time Calculation (min)  54 min    Activity Tolerance  Patient tolerated treatment well;No increased pain    Behavior During Therapy  WFL for tasks assessed/performed       Past Medical History:  Diagnosis Date  . Hypertriglyceridemia 01/28/2016    History reviewed. No pertinent surgical history.  There were no vitals filed for this visit.  Subjective Assessment - 02/06/19 0852    Subjective  The patient currently does not have pain radiating into R arm at this time.  He is noticing worsening pain C6-C7 region radiating into bilateral shoulders and into proximal L arm.    Pertinent History  DM    Diagnostic tests  mri scheduled for today    Patient Stated Goals  get rid of his sx in arm.    Currently in Pain?  Yes    Pain Score  4    goes up to 8 or 9 with prolonged sitting   Pain Location  Neck    Pain Orientation  Right;Left    Pain Descriptors / Indicators  Radiating;Aching    Pain Type  Acute pain    Pain Radiating Towards  into L proximal arm today (usually in R arm)    Pain Onset  More than a month ago    Pain Frequency  Constant    Aggravating Factors   sitting up    Pain Relieving Factors  sitting in his reclines, reclined all the way back                       [facility] Adult PT Treatment/Exercise - 02/06/19 0905      Neck Exercises: Sidelying   Lateral Flexion  5 reps   resisted isometrics for contract/relax   Other Sidelying Exercise  thoracic  opening x 10 reps R and L sides    Other Sidelying Exercise  diagonal D1/D2 scapular ROM      Traction   Type of Traction  Cervical    Min (lbs)  10    Max (lbs)  15    Hold Time  60    Rest Time  20    Time  15      Manual Therapy   Manual Therapy  Soft tissue mobilization;Joint mobilization;Manual Traction;Scapular mobilization    Manual therapy comments  to reduce pain, improve joint and soft tissue mobility    Joint Mobilization  PA mobs thoracic grade III-IV, mobilization with movement duirng prone pressups for thoracic spine into PA direction, rib mobilization instructing patient in self mobilization technique *see addition to HEP.; cervical PA mobs grade II-III mid to lower c-spine for PA and lateral glides.    Soft tissue mobilization  bilateral scalenes, neck stabilizers, and parascapular musculature    Scapular Mobilization  sidelying scapular mobilization to improve scapular and thoracic mobility    Manual Traction  gentle supine cervical traction during joint  mobiization for lengthening             PT Education - 02/06/19 1331    Education Details  HEP    Person(s) Educated  Patient    Methods  Explanation;Demonstration;Handout    Comprehension  Verbalized understanding;Returned demonstration          PT Long Term Goals - 01/24/19 0843      PT LONG TERM GOAL #1   Title  Patient ind with HEP    Time  6    Period  Weeks    Status  Partially Met      PT LONG TERM GOAL #2   Title  Patient to report no pain in neck and arm with ADLs.    Time  6    Period  Weeks    Status  On-going      PT LONG TERM GOAL #3   Title  Patient to demo 5/5 RUE strength and Rt grip strength of 100# or better    Time  6    Period  Weeks    Status  On-going      PT LONG TERM GOAL #4   Title  Patient able to drive without pain.    Time  6    Period  Weeks    Status  On-going            Plan - 02/06/19 1345    Clinical Impression Statement  The patient has improved  R UE pain and toay instead has greater centralization of symptoms over low cerivcal/upper thoracic spine radiating into the left arm.  He has significantly reduced joint mobilization in upper thoracic spine that improves with manual techniques today.  His pain level reduced to 0/10 today with therapy without radiating symptoms.  He also notes relief of symptoms with rib depression mobilization using a gait belt and sustained stretch (see HEP).  Plan to check on how patient feels post injection (scheduled for Monday) and progress therapy to tolerance.    Rehab Potential  Good    PT Frequency  2x / week    PT Duration  6 weeks    PT Treatment/Interventions  ADLs/Self Care Home Management;Cryotherapy;Electrical Stimulation;Moist Heat;Traction;Ultrasound;Therapeutic exercise;Neuromuscular re-education;Patient/family education;Manual techniques;Dry needling    PT Next Visit Plan  Upper thoracic PA mobilization, and lateral glides to improve mobility; first rib depression, thoracic self mobilization for home exercises, work on posture and mobilization with movement; Continue traction and cervical stabilization. manual therapy to Rt scalenes.    PT Home Exercise Plan  [place] and Agree with Plan of Care  Patient       Patient will benefit from skilled therapeutic intervention in order to improve the following deficits and impairments:  Decreased range of motion, Impaired UE functional use, Pain, Decreased strength, Postural dysfunction  Visit Diagnosis: Right cervical radiculopathy  Muscle weakness (generalized)     Problem List Patient Active Problem List   Diagnosis Date Noted  . Right cervical radiculopathy 01/06/2019  . Controlled type 2 diabetes mellitus without complication, without long-term current use of insulin ([place]) 08/29/2018  . Benign essential hypertension 08/29/2018  . Mass of wrist, left 10/12/2017  . Overweight (BMI 25.0-29.9) 09/22/2016  . Skin lesion of hand  09/22/2016  . Erectile dysfunction 09/22/2016  . Lumbar degenerative disc disease 06/20/2016  . Hypertriglyceridemia 01/28/2016  . Annual physical exam 01/27/2016  . Corn 01/27/2016  . Hallux valgus, left 01/27/2016    Dvid Pendry , PT 02/06/2019,  1:48 PM  [facility] [place], [place], [address] Phone: [phone]   Fax:  [phone]  Name: Carl Griffith MRN: [id number] Date of Birth: 15-Sep-1975

## 2019-02-10 ENCOUNTER — Other Ambulatory Visit: Payer: Self-pay

## 2019-02-10 ENCOUNTER — Ambulatory Visit
Admission: RE | Admit: 2019-02-10 | Discharge: 2019-02-10 | Disposition: A | Discharge status: Home / Self Care | Payer: BC Managed Care – PPO | Source: Ambulatory Visit | Attending: Sports Medicine | Admitting: Sports Medicine

## 2019-02-10 DIAGNOSIS — M5412 Radiculopathy, cervical region: Secondary | ICD-10-CM

## 2019-02-10 MED ORDER — IOPAMIDOL (ISOVUE-M 300) INJECTION 61%
1.0000 mL | Freq: Once | INTRAMUSCULAR | Status: AC
  Administered 2019-02-10: 1 mL via EPIDURAL

## 2019-02-10 MED ORDER — TRIAMCINOLONE ACETONIDE 40 MG/ML IJ SUSP (RADIOLOGY): 60.0000 mg | Freq: Once | INTRAMUSCULAR | Status: DC

## 2019-02-10 MED ORDER — IOPAMIDOL (ISOVUE-M 300) INJECTION 61%: 1.0000 mL | Freq: Once | INTRAMUSCULAR | Status: DC | PRN

## 2019-02-10 MED ORDER — TRIAMCINOLONE ACETONIDE 40 MG/ML IJ SUSP (RADIOLOGY)
60.0000 mg | Freq: Once | INTRAMUSCULAR | Status: AC
  Administered 2019-02-10: 60 mg via EPIDURAL

## 2019-02-13 ENCOUNTER — Other Ambulatory Visit: Payer: Self-pay

## 2019-02-13 ENCOUNTER — Ambulatory Visit (INDEPENDENT_AMBULATORY_CARE_PROVIDER_SITE_OTHER): Payer: BC Managed Care – PPO | Admitting: Physical Therapy

## 2019-02-13 ENCOUNTER — Encounter: Payer: Self-pay | Admitting: Physical Therapy

## 2019-02-13 DIAGNOSIS — M5412 Radiculopathy, cervical region: Secondary | ICD-10-CM

## 2019-02-13 DIAGNOSIS — M6281 Muscle weakness (generalized): Secondary | ICD-10-CM | POA: Diagnosis not present

## 2019-02-13 NOTE — Therapy (Signed)
[place], [place], [address] Phone: [phone]   Fax:  [phone]  Physical Therapy Treatment  Patient Details  Name: Carl Griffith MRN: [id number] Date of Birth: [birth year] Referring Provider (PT): Dr. Dianah Field   Encounter Date: 02/13/2019  PT End of Session - 02/13/19 0932    Visit Number  9    Number of Visits  12    Date for PT Re-Evaluation  02/21/19    PT Start Time  0932    PT Stop Time  1657    PT Time Calculation (min)  51 min    Activity Tolerance  Patient tolerated treatment well;No increased pain    Behavior During Therapy  WFL for tasks assessed/performed       Past Medical History:  Diagnosis Date  . Hypertriglyceridemia 01/28/2016    History reviewed. No pertinent surgical history.  There were no vitals filed for this visit.  Subjective Assessment - 02/13/19 0933    Subjective  Patient reports this is the best day so far. Still having bil shoulder pain. Felt really good after last treatment.    Pertinent History  DM    Patient Stated Goals  get rid of his sx in arm.    Currently in Pain?  Yes    Pain Score  2     Pain Location  Neck    Pain Orientation  Right;Left    Pain Descriptors / Indicators  Aching    Pain Type  Acute pain         [facility] PT Assessment - 02/13/19 0001      Strength   Overall Strength Comments  Rt shoulder flex and wrist ext 5-/5, elbow flex 5/5    Right Hand Grip (lbs)  --   95/96/92                  [facility] Adult PT Treatment/Exercise - 02/13/19 0001      Modalities   Modalities  Traction      Traction   Type of Traction  Cervical    Min (lbs)  10    Max (lbs)  15    Hold Time  60    Rest Time  20    Time  15      Manual Therapy   Manual Therapy  Soft tissue mobilization;Joint mobilization;Myofascial release    Manual therapy comments  skilled palpation and monitoring of soft tissue during DN      Joint  Mobilization  PA mobs thoracic grade III-IV, mobilization with movement duirng prone pressups for thoracic spine into PA direction, gd II/III lower cervical mobs    Soft tissue mobilization  to right lumbar and C/T/L paraspinals    Myofascial Release  in SDLY to right QL prior to DN.       Trigger Point Dry Needling - 02/13/19 0001    Consent Given?  Yes    Education Handout Provided  Previously provided    Muscles Treated Head and Neck  Semispinalis capitus;Cervical multifidi    Muscles Treated Back/Hip  Quadratus lumborum    Dry Needling Comments  right    Semispinalis capitus Response  Twitch reponse elicited;Palpable increased muscle length    Cervical multifidi Response  Twitch reponse elicited;Palpable increased muscle length    Quadratus Lumborum Response  Twitch response elicited;Palpable increased muscle length                PT Long Term Goals -  02/13/19 0936      PT LONG TERM GOAL #1   Title  Patient ind with HEP    Status  Partially Met      PT LONG TERM GOAL #2   Title  Patient to report no pain in neck and arm with ADLs.    Status  Partially Met      PT LONG TERM GOAL #3   Title  Patient to demo 5/5 RUE strength and Rt grip strength of 100# or better    Status  Partially Met      PT LONG TERM GOAL #4   Title  Patient able to drive without pain.    Status  Achieved            Plan - 02/13/19 1017    Clinical Impression Statement  Patient with improved thoracic mobility but still stiff and painful at T7 level. Increased tone in right paraspinals and right QL.  Good response to DN here. Patient has significant improvement in Rt grip strength since eval.    PT Frequency  2x / week    PT Duration  6 weeks    PT Treatment/Interventions  ADLs/Self Care Home Management;Cryotherapy;Electrical Stimulation;Moist Heat;Traction;Ultrasound;Therapeutic exercise;Neuromuscular re-education;Patient/family education;Manual techniques;Dry needling    PT Next Visit  Plan  Continue Tspine mobs, thoracic self mobilization for home exercises, work on posture and mobilization with movement; Also work simulation activities;  Continue traction and cervical stabilization. manual therapy to Rt scalenes.    PT Home Exercise Plan  [facility]       Patient will benefit from skilled therapeutic intervention in order to improve the following deficits and impairments:  Decreased range of motion, Impaired UE functional use, Pain, Decreased strength, Postural dysfunction  Visit Diagnosis: Right cervical radiculopathy  Muscle weakness (generalized)     Problem List Patient Active Problem List   Diagnosis Date Noted  . Right cervical radiculopathy 01/06/2019  . Controlled type 2 diabetes mellitus without complication, without long-term current use of insulin ([place]) 08/29/2018  . Benign essential hypertension 08/29/2018  . Mass of wrist, left 10/12/2017  . Overweight (BMI 25.0-29.9) 09/22/2016  . Skin lesion of hand 09/22/2016  . Erectile dysfunction 09/22/2016  . Lumbar degenerative disc disease 06/20/2016  . Hypertriglyceridemia 01/28/2016  . Annual physical exam 01/27/2016  . Corn 01/27/2016  . Hallux valgus, left 01/27/2016    [name] PT 02/13/2019, 1:45 PM  [facility] [place], [place], [address] Phone: [phone]   Fax:  [phone]  Name: Carl Griffith MRN: [id number] Date of Birth: 20-Feb-1975

## 2019-02-18 ENCOUNTER — Encounter: Payer: Self-pay | Admitting: Physical Therapy

## 2019-02-18 ENCOUNTER — Ambulatory Visit (INDEPENDENT_AMBULATORY_CARE_PROVIDER_SITE_OTHER): Payer: BC Managed Care – PPO | Admitting: Physical Therapy

## 2019-02-18 ENCOUNTER — Other Ambulatory Visit: Payer: Self-pay

## 2019-02-18 DIAGNOSIS — M5412 Radiculopathy, cervical region: Secondary | ICD-10-CM | POA: Diagnosis not present

## 2019-02-18 DIAGNOSIS — M6281 Muscle weakness (generalized): Secondary | ICD-10-CM | POA: Diagnosis not present

## 2019-02-18 NOTE — Therapy (Signed)
[place], [place], [address] Phone: [phone]   Fax:  [phone]  Physical Therapy Treatment  Patient Details  Name: Carl Griffith MRN: [id number] Date of Birth: 1975/03/22 Referring Provider (PT): Dr. Dianah Field   Encounter Date: 02/18/2019  PT End of Session - 02/18/19 0850    Visit Number  10    Number of Visits  12    Date for PT Re-Evaluation  02/21/19    PT Start Time  0850    PT Stop Time  0930    PT Time Calculation (min)  40 min    Activity Tolerance  Patient tolerated treatment well;No increased pain    Behavior During Therapy  WFL for tasks assessed/performed       Past Medical History:  Diagnosis Date  . Hypertriglyceridemia 01/28/2016    History reviewed. No pertinent surgical history.  There were no vitals filed for this visit.  Subjective Assessment - 02/18/19 0851    Subjective  Patient having no pain since Sunday.    Patient Stated Goals  get rid of his sx in arm.    Currently in Pain?  No/denies                       [facility] Adult PT Treatment/Exercise - 02/18/19 0001      Self-Care   Self-Care  ADL's    ADL's  body mechanics with lifting reviewed during TE; discussed pushing rather than pulling to avoid further injury      Neck Exercises: Machines for Strengthening   Cybex Chest Press  4 plates 1 x 10 vert and horiz grip    Lat Pull  5 plates 2 x 10    Other Machines for Strengthening  Lawn mower pull 4 plates x 10 ea      Neck Exercises: Standing   Lift / Chop  5 reps    Left / Chop Limitations  poor squatting leading to tightness in LB    Other Standing Exercises  row with one hand on mat table 10# x 10 ea    Other Standing Exercises  functional squat x 10; Pushing sled 40# 4x with cues to engage abdominals and stabilize neck   Verbal cues to sit bottom back     Manual Therapy   Manual Therapy  Joint mobilization;Soft tissue  mobilization    Manual therapy comments  skilled palpation and monitoring of soft tissue during DN      Joint Mobilization  PA mobs thoracic gd III WNL, lumbar unilateral Gd II/III (tight)    Soft tissue mobilization  IASTM in bil UT and cervical paraspinals       Trigger Point Dry Needling - 02/18/19 0001    Consent Given?  Yes    Education Handout Provided  Previously provided    Muscles Treated Head and Neck  Cervical multifidi;Upper trapezius    Dry Needling Comments  --    Upper Trapezius Response  Twitch reponse elicited;Palpable increased muscle length   bil   Cervical multifidi Response  Twitch reponse elicited;Palpable increased muscle length   right          PT Education - 02/18/19 1525    Education Details  See self care    Person(s) Educated  Patient    Methods  Explanation;Demonstration    Comprehension  Verbalized understanding;Returned demonstration          PT Long Term Goals - 02/13/19 5427  PT LONG TERM GOAL #1   Title  Patient ind with HEP    Status  Partially Met      PT LONG TERM GOAL #2   Title  Patient to report no pain in neck and arm with ADLs.    Status  Partially Met      PT LONG TERM GOAL #3   Title  Patient to demo 5/5 RUE strength and Rt grip strength of 100# or better    Status  Partially Met      PT LONG TERM GOAL #4   Title  Patient able to drive without pain.    Status  Achieved            Plan - 02/18/19 1525    Clinical Impression Statement  Patient tolerated increased strengthening fairly well today. He reported tightness in bil neck after TE and responded well to manual therapy and DN  in bil UT and cspine. He showed poor mechanics with lift/chop with minimal bending of knees. He reported "feeliing it" in his back so we stopped chop and focused on functiona squats. He will benefit from further lifting activities next visit before RTW.    PT Treatment/Interventions  ADLs/Self Care Home  Management;Cryotherapy;Electrical Stimulation;Moist Heat;Traction;Ultrasound;Therapeutic exercise;Neuromuscular re-education;Patient/family education;Manual techniques;Dry needling    PT Next Visit Plan  MD note; assess goals, body mechanics review; work on proper lifting. Pt planning to ask MD to RTW. Discuss placing on hold as pt returns to work in order to monitor sx.    PT Home Exercise Plan  [facility]       Patient will benefit from skilled therapeutic intervention in order to improve the following deficits and impairments:  Decreased range of motion, Impaired UE functional use, Pain, Decreased strength, Postural dysfunction  Visit Diagnosis: Right cervical radiculopathy  Muscle weakness (generalized)     Problem List Patient Active Problem List   Diagnosis Date Noted  . Right cervical radiculopathy 01/06/2019  . Controlled type 2 diabetes mellitus without complication, without long-term current use of insulin ([place]) 08/29/2018  . Benign essential hypertension 08/29/2018  . Mass of wrist, left 10/12/2017  . Overweight (BMI 25.0-29.9) 09/22/2016  . Skin lesion of hand 09/22/2016  . Erectile dysfunction 09/22/2016  . Lumbar degenerative disc disease 06/20/2016  . Hypertriglyceridemia 01/28/2016  . Annual physical exam 01/27/2016  . Corn 01/27/2016  . Hallux valgus, left 01/27/2016    [name] PT 02/18/2019, 3:34 PM  [facility] [place], [place], [address] Phone: [phone]   Fax:  [phone]  Name: Keith Felten MRN: [id number] Date of Birth: October 26, 1975

## 2019-02-20 ENCOUNTER — Other Ambulatory Visit: Payer: Self-pay

## 2019-02-20 ENCOUNTER — Ambulatory Visit (INDEPENDENT_AMBULATORY_CARE_PROVIDER_SITE_OTHER): Payer: BC Managed Care – PPO | Admitting: Physical Therapy

## 2019-02-20 DIAGNOSIS — M5412 Radiculopathy, cervical region: Secondary | ICD-10-CM | POA: Diagnosis not present

## 2019-02-20 DIAGNOSIS — M6281 Muscle weakness (generalized): Secondary | ICD-10-CM | POA: Diagnosis not present

## 2019-02-20 NOTE — Therapy (Addendum)
[place], [place], [address] Phone: [phone]   Fax:  [phone]  Physical Therapy Treatment and Discharge Summary  Patient Details  Name: Carl Griffith MRN: [id number] Date of Birth: 01-Nov-1975 Referring Provider (PT): Dr. Dianah Field   Encounter Date: 02/20/2019  PT End of Session - 02/20/19 0801    Visit Number  11    Number of Visits  12    Date for PT Re-Evaluation  02/21/19    PT Start Time  0801    PT Stop Time  0850    PT Time Calculation (min)  49 min    Activity Tolerance  Patient tolerated treatment well;No increased pain    Behavior During Therapy  WFL for tasks assessed/performed       Past Medical History:  Diagnosis Date  . Hypertriglyceridemia 01/28/2016    No past surgical history on file.  There were no vitals filed for this visit.  Subjective Assessment - 02/20/19 0801    Subjective  Just sore after last visit. No pain or UE sx to report.    Patient Stated Goals  get rid of his sx in arm.         [facility] PT Assessment - 02/20/19 0001      Strength   Overall Strength Comments  5/5 right shoulder    Right Hand Grip (lbs)  96   95/95/97                  [facility] Adult PT Treatment/Exercise - 02/20/19 0001      Neck Exercises: Machines for Strengthening   Cybex Chest Press  4 plates 2 x 10 vert and horiz grip    Lat Pull  5 plates 2 x 10    Other Machines for Strengthening  Lawn mower pull 4 plates 2 x 10 ea    Other Machines for Strengthening  leg press 6 plates 2x10      Neck Exercises: Standing   Other Standing Exercises  standing lunge x 10 ea, func squat x 10; wall sits 10 x 5 sec hold; lunge with bil protraction black band 2x10             PT Education - 02/20/19 1000    Education Details  HEP progressed    Person(s) Educated  Patient    Methods  Explanation;Demonstration;Handout    Comprehension  Verbalized  understanding;Returned demonstration          PT Long Term Goals - 02/20/19 0802      PT LONG TERM GOAL #1   Title  Patient ind with HEP    Status  Achieved      PT LONG TERM GOAL #2   Title  Patient to report no pain in neck and arm with ADLs.    Time  6    Period  Weeks    Status  Achieved      PT LONG TERM GOAL #3   Title  Patient to demo 5/5 RUE strength and Rt grip strength of 100# or better    Baseline  95# on 02/20/19    Status  Partially Met      PT LONG TERM GOAL #4   Title  Patient able to drive without pain.    Status  Achieved            Plan - 02/20/19 0956    Clinical Impression Statement  Patient presents with no complaints of pain x  5 days. He responded well to TE last visit and today with no pain reported. He has met all goals except grip strength which is within 5# of goal. Patent planning to see Dr. Dianah Field and try to get released for work. Plan to place pt on hold for 30 days.    PT Frequency  2x / week    PT Duration  6 weeks    PT Treatment/Interventions  ADLs/Self Care Home Management;Cryotherapy;Electrical Stimulation;Moist Heat;Traction;Ultrasound;Therapeutic exercise;Neuromuscular re-education;Patient/family education;Manual techniques;Dry needling    PT Next Visit Plan  Hold until 03/20/19 then d/c if no return.    PT Home Exercise Plan  [place] and Agree with Plan of Care  Patient       Patient will benefit from skilled therapeutic intervention in order to improve the following deficits and impairments:  Decreased range of motion, Impaired UE functional use, Pain, Decreased strength, Postural dysfunction  Visit Diagnosis: Right cervical radiculopathy  Muscle weakness (generalized)     Problem List Patient Active Problem List   Diagnosis Date Noted  . Right cervical radiculopathy 01/06/2019  . Controlled type 2 diabetes mellitus without complication, without long-term current use of insulin ([place]) 08/29/2018  .  Benign essential hypertension 08/29/2018  . Mass of wrist, left 10/12/2017  . Overweight (BMI 25.0-29.9) 09/22/2016  . Skin lesion of hand 09/22/2016  . Erectile dysfunction 09/22/2016  . Lumbar degenerative disc disease 06/20/2016  . Hypertriglyceridemia 01/28/2016  . Annual physical exam 01/27/2016  . Corn 01/27/2016  . Hallux valgus, left 01/27/2016    [name] PT 02/20/2019, 10:04 AM  [facility] [place] [place], [place], [address] Phone: [phone]   Fax:  [phone]  Name: Carl Griffith MRN: [id number] Date of Birth: [birth year]  PHYSICAL THERAPY DISCHARGE SUMMARY  Visits from Start of Care: 11  Current functional level related to goals / functional outcomes: See above   Remaining deficits: See above   Education / Equipment: HEP Plan: Patient agrees to discharge.  Patient goals were not met. Patient is being discharged due to being pleased with the current functional level.  ?????    [name], PT 04/10/19 9:55 PM  [facility] [place], [place] [address]  [phone] (office) [phone] (fax)

## 2019-02-20 NOTE — Patient Instructions (Signed)
Access Code: [facility]  URL: [email or web address]  Date: 02/20/2019  Prepared by: Solon Palm   Exercises Supine Chin Tuck - 10 reps - 1 sets - 3-5 hold - 4x daily - 7x weekly                  Seated Scapular Retraction - 5 reps - 1 sets - 5-10 seconds hold - 3x daily - 7x weekly W's - this is a stick up position - 5 reps - 1 sets - 5 seconds hold - 2x daily - 7x weekly Sidelying Thoracic Rotation with Open Book - 10 reps - 1 sets - 2x daily - 7x weekly First Rib Mobilization with Strap - 3 reps - 1 sets - 15-20 seconds hold - 2x daily - 7x weekly Prone Press Up with Lateral Shifts - 10 reps - 1 sets - 2x daily - 7x weekly Standing Diagonal Chops with Medicine Ball - 10 reps - 3 sets - 1x daily - 3x weekly Squat - 10 reps - 3 sets - 1x daily - 3x weekly Wall Squat - 10 reps - 3 sets - 1x daily - 3x weekly Single Knee to Chest Stretch - 3 reps - 1 sets - 20 sec hold - 1x daily - 7x weekly Standing Serratus Punch with Resistance - 10 reps - 3 sets - 1x daily - 3x weekly Patient Education Office Posture

## 2019-03-05 ENCOUNTER — Other Ambulatory Visit: Payer: Self-pay

## 2019-03-05 ENCOUNTER — Ambulatory Visit (INDEPENDENT_AMBULATORY_CARE_PROVIDER_SITE_OTHER): Payer: BC Managed Care – PPO | Admitting: Sports Medicine

## 2019-03-05 DIAGNOSIS — M5412 Radiculopathy, cervical region: Secondary | ICD-10-CM

## 2019-03-05 NOTE — Assessment & Plan Note (Signed)
Carl Griffith returns, he has right C7 radiculitis that responded well to a cervical epidural. He may return to work without restrictions.

## 2019-03-05 NOTE — Progress Notes (Signed)
    Procedures performed today:    None.  Independent interpretation of tests performed by another provider:   None.  Impression and Recommendations:    Right cervical radiculopathy Carl Griffith returns, he has right C7 radiculitis that responded well to a cervical epidural. He may return to work without restrictions.    ___________________________________________ [name]. [name], M.D., ABFM., CAQSM. Primary Care and Sports Medicine [facility]  Adjunct Instructor of Family Medicine  [employer]

## 2019-05-17 ENCOUNTER — Other Ambulatory Visit: Payer: Self-pay | Admitting: Sports Medicine

## 2019-05-17 DIAGNOSIS — E781 Pure hyperglyceridemia: Secondary | ICD-10-CM

## 2019-10-27 ENCOUNTER — Other Ambulatory Visit: Payer: Self-pay

## 2019-10-27 ENCOUNTER — Encounter: Payer: Self-pay | Admitting: Sports Medicine

## 2019-10-27 ENCOUNTER — Ambulatory Visit: Payer: BC Managed Care – PPO | Admitting: Sports Medicine

## 2019-10-27 DIAGNOSIS — F4310 Post-traumatic stress disorder, unspecified: Secondary | ICD-10-CM | POA: Insufficient documentation

## 2019-10-27 MED ORDER — ALPRAZOLAM 0.5 MG PO TABS: 0.5000 mg | ORAL_TABLET | Freq: Three times a day (TID) | ORAL | 0 refills | Status: AC | PRN
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
## 2019-11-05 ENCOUNTER — Ambulatory Visit: Payer: BC Managed Care – PPO | Admitting: Sports Medicine

## 2019-11-11 ENCOUNTER — Telehealth: Payer: Self-pay | Admitting: *Deleted

## 2019-11-11 DIAGNOSIS — M5412 Radiculopathy, cervical region: Secondary | ICD-10-CM

## 2019-11-11 NOTE — Telephone Encounter (Signed)
Orders placed, please contact [facility] for scheduling. 

## 2019-11-11 NOTE — Telephone Encounter (Signed)
Pt's wife left vm wanting you to place an order for another cervical epidural for pt.

## 2019-11-12 NOTE — Telephone Encounter (Signed)
Spoke with Carl Griffith at [facility] and gave her patient MRN to get him scheduled.

## 2019-11-14 NOTE — Telephone Encounter (Signed)
This is odd, his MRI is from 2020, less than a year ago, he does not need another one unless his symptoms have changed significantly.  Maybe they did not realize he already had his MRI, if anything he needs to go to [facility] where he had the MRI performed and get them to bring a disc.  Please also call [facility] and let them know he had an MRI just a matter of 10 months ago.

## 2019-11-14 NOTE — Telephone Encounter (Signed)
Pt's wife called - pt got an appointment to have the injection completed. However, pt was informed that he will need another MRI/CT Scan before his injection. Wife is requesting to expedite the request, pt continues to have a lot of pain.

## 2019-11-20 NOTE — Telephone Encounter (Signed)
Left msg for [facility] for clarification

## 2019-11-21 NOTE — Telephone Encounter (Signed)
Called and clarified with Olegario Messier at [facility].   Patient CAN get copy of images from [facility] and take that to GI and will NOT need additional MRI>   Patient advised, he is getting disk and calling GI to schedule epidural.   All parties have my call back info in case of any other needs.  Dr T- no need to order MRI at this time. Thanks!

## 2019-11-26 ENCOUNTER — Ambulatory Visit: Payer: BC Managed Care – PPO | Admitting: Sports Medicine

## 2019-11-28 ENCOUNTER — Ambulatory Visit
Admission: RE | Admit: 2019-11-28 | Discharge: 2019-11-28 | Disposition: A | Discharge status: Home / Self Care | Payer: BC Managed Care – PPO | Source: Ambulatory Visit | Attending: Sports Medicine | Admitting: Sports Medicine

## 2019-11-28 ENCOUNTER — Other Ambulatory Visit: Payer: Self-pay

## 2019-11-28 DIAGNOSIS — M5412 Radiculopathy, cervical region: Secondary | ICD-10-CM

## 2019-11-28 MED ORDER — TRIAMCINOLONE ACETONIDE 40 MG/ML IJ SUSP (RADIOLOGY)
60.0000 mg | Freq: Once | INTRAMUSCULAR | Status: AC
  Administered 2019-11-28: 60 mg via EPIDURAL

## 2019-11-28 MED ORDER — IOPAMIDOL (ISOVUE-M 300) INJECTION 61%
1.0000 mL | Freq: Once | INTRAMUSCULAR | Status: AC | PRN
  Administered 2019-11-28: 1 mL via EPIDURAL

## 2019-11-28 NOTE — Discharge Instructions (Signed)

## 2019-11-30 ENCOUNTER — Other Ambulatory Visit: Payer: Self-pay | Admitting: Sports Medicine

## 2019-12-11 ENCOUNTER — Encounter: Payer: Self-pay | Admitting: Sports Medicine

## 2019-12-11 ENCOUNTER — Ambulatory Visit: Payer: BC Managed Care – PPO | Admitting: Sports Medicine

## 2019-12-11 DIAGNOSIS — M5412 Radiculopathy, cervical region: Secondary | ICD-10-CM | POA: Diagnosis not present

## 2019-12-11 NOTE — Assessment & Plan Note (Addendum)
Master returns, he has right C7 radiculitis, he has responded well to cervical epidurals, the last of which was in October of this year. Of note he does have a MRI from December 2020 at [facility] showing a At C6-C7, a posterior disc osteophyte complex with superimposed central and left eccentric disc herniation contacts the cord with moderate central stenosis. There is narrowing of the left lateral canal with disc that may impinge on the exiting left C7 nerve roots with moderate left foraminal stenosis. Mild to moderate right foraminal stenosis.  He is doing well at this juncture but would certainly like a surgical opinion as well.  I did fill out some short-term disability/FMLA paperwork to allow him time off for occasional injections.

## 2019-12-11 NOTE — Progress Notes (Signed)
    Procedures performed today:    None.  Independent interpretation of notes and tests performed by another provider:   None.  Brief History, Exam, Impression, and Recommendations:    Right cervical radiculopathy Carl Griffith returns, he has right C7 radiculitis, he has responded well to cervical epidurals, the last of which was in October of this year. Of note he does have a MRI from December 2020 at [facility] showing a At C6-C7, a posterior disc osteophyte complex with superimposed central and left eccentric disc herniation contacts the cord with moderate central stenosis. There is narrowing of the left lateral canal with disc that may impinge on the exiting left C7 nerve roots with moderate left foraminal stenosis. Mild to moderate right foraminal stenosis.  He is doing well at this juncture but would certainly like a surgical opinion as well.  I did fill out some short-term disability/FMLA paperwork to allow him time off for occasional injections.    ___________________________________________ Carl Griffith. Carl Griffith, M.D., ABFM., CAQSM. Primary Care and Sports Medicine [facility]  Adjunct Instructor of Family Medicine  [employer]

## 2020-01-13 ENCOUNTER — Telehealth: Payer: Self-pay | Admitting: Sports Medicine

## 2020-01-13 NOTE — Telephone Encounter (Signed)
Updated disability paperwork was filled out today during his wife's appointment slot.

## 2020-01-15 ENCOUNTER — Ambulatory Visit: Payer: BC Managed Care – PPO | Admitting: Sports Medicine

## 2020-01-27 ENCOUNTER — Other Ambulatory Visit: Payer: Self-pay | Admitting: Orthopedic Surgery

## 2020-02-13 NOTE — Progress Notes (Signed)
[id number] Lorenza Evangelist, Eugenio Saenz - [address] [place] [address] Phone: [phone] Fax: [phone]      Your procedure is scheduled on Thursday, January 13th.  Report to [facility] Main Entrance "A" at 5:30 A.M., and check in at the Admitting office.  Call this number if you have problems the morning of surgery:  [phone]  Call [phone] if you have any questions prior to your surgery date Monday-Friday 8am-4pm    Remember:  Do not eat after midnight the night before your surgery  You may drink clear liquids until 4:30 AM the morning of your surgery.   Clear liquids allowed are: Water, Non-Citrus Juices (without pulp), Carbonated Beverages, Clear Tea, Black Coffee Only, and Gatorade    Take these medicines the morning of surgery with A SIP OF WATER   Alprazolam (Xanax)  Allegra - if needed  Cyclobenzaprine (Flexeril)  Methocarbamol (Robaxin)   Stop taking Phentermine as of today, if not already stopped.   As of today, STOP taking any Aspirin (unless otherwise instructed by your surgeon) Aleve, Naproxen, Ibuprofen, Motrin, Advil, Goody's, BC's, all herbal medications, fish oil, and all vitamins.   WHAT DO I DO ABOUT MY DIABETES MEDICATION?   . THE MORNING OF SURGERY, do NOT take Ozempic   HOW TO MANAGE YOUR DIABETES BEFORE AND AFTER SURGERY  Why is it important to control my blood sugar before and after surgery? . Improving blood sugar levels before and after surgery helps healing and can limit problems. . A way of improving blood sugar control is eating a healthy diet by: o  Eating less sugar and carbohydrates o  Increasing activity/exercise o  Talking with your doctor about reaching your blood sugar goals . High blood sugars (greater than 180 mg/dL) can raise your risk of infections and slow your recovery, so you will need to focus on controlling your diabetes during the weeks before surgery. . Make sure that the doctor  who takes care of your diabetes knows about your planned surgery including the date and location.  How do I manage my blood sugar before surgery? . Check your blood sugar at least 4 times a day, starting 2 days before surgery, to make sure that the level is not too high or low. . Check your blood sugar the morning of your surgery when you wake up and every 2 hours until you get to the Short Stay unit. o If your blood sugar is less than 70 mg/dL, you will need to treat for low blood sugar: - Do not take insulin. - Treat a low blood sugar (less than 70 mg/dL) with  cup of clear juice (cranberry or apple), 4 glucose tablets, OR glucose gel. - Recheck blood sugar in 15 minutes after treatment (to make sure it is greater than 70 mg/dL). If your blood sugar is not greater than 70 mg/dL on recheck, call [phone] for further instructions. . Report your blood sugar to the short stay nurse when you get to Short Stay.  . If you are admitted to the hospital after surgery: o Your blood sugar will be checked by the staff and you will probably be given insulin after surgery (instead of oral diabetes medicines) to make sure you have good blood sugar levels. o The goal for blood sugar control after surgery is 80-180 mg/dL.               DAY OF SURGERY:  Do not wear jewelry, make up, or nail polish            Do not wear lotions, powders, perfumes/colognes, or deodorant.            Do not shave 48 hours prior to surgery.  Men may shave face and neck.            Do not bring valuables to the hospital.            [facility] is not responsible for any belongings or valuables.  Do NOT Smoke (Tobacco/Vaping) or drink Alcohol 24 hours prior to your procedure If you use a CPAP at night, you may bring all equipment for your overnight stay.   Contacts, glasses, dentures or bridgework may not be worn into surgery.      For patients admitted to the hospital, discharge time will be determined by your  treatment team.   Patients discharged the day of surgery will not be allowed to drive home, and someone needs to stay with them for 24 hours.    Special instructions:   [facility]- Preparing For Surgery  Before surgery, you can play an important role. Because skin is not sterile, your skin needs to be as free of germs as possible. You can reduce the number of germs on your skin by washing with CHG (chlorahexidine gluconate) Soap before surgery.  CHG is an antiseptic cleaner which kills germs and bonds with the skin to continue killing germs even after washing.    Oral Hygiene is also important to reduce your risk of infection.  Remember - BRUSH YOUR TEETH THE MORNING OF SURGERY WITH YOUR REGULAR TOOTHPASTE  Please do not use if you have an allergy to CHG or antibacterial soaps. If your skin becomes reddened/irritated stop using the CHG.  Do not shave (including legs and underarms) for at least 48 hours prior to first CHG shower. It is OK to shave your face.  Please follow these instructions carefully.   1. Shower the NIGHT BEFORE SURGERY and the MORNING OF SURGERY with CHG Soap.   2. If you chose to wash your hair, wash your hair first as usual with your normal shampoo.  3. After you shampoo, rinse your hair and body thoroughly to remove the shampoo.  4. Use CHG as you would any other liquid soap. You can apply CHG directly to the skin and wash gently with a scrungie or a clean washcloth.   5. Apply the CHG Soap to your body ONLY FROM THE NECK DOWN.  Do not use on open wounds or open sores. Avoid contact with your eyes, ears, mouth and genitals (private parts). Wash Face and genitals (private parts)  with your normal soap.   6. Wash thoroughly, paying special attention to the area where your surgery will be performed.  7. Thoroughly rinse your body with warm water from the neck down.  8. DO NOT shower/wash with your normal soap after using and rinsing off the CHG Soap.  9. Pat  yourself dry with a CLEAN TOWEL.  10. Wear CLEAN PAJAMAS to bed the night before surgery  11. Place CLEAN SHEETS on your bed the night of your first shower and DO NOT SLEEP WITH PETS.   Day of Surgery: Wear Clean/Comfortable clothing the morning of surgery Do not apply any deodorants/lotions.   Remember to brush your teeth WITH YOUR REGULAR TOOTHPASTE.   Please read over the following fact sheets that you were given.

## 2020-02-16 ENCOUNTER — Encounter (HOSPITAL_COMMUNITY): Payer: Self-pay

## 2020-02-16 ENCOUNTER — Other Ambulatory Visit (HOSPITAL_COMMUNITY)
Admission: RE | Admit: 2020-02-16 | Discharge: 2020-02-16 | Disposition: A | Discharge status: Home / Self Care | Payer: BC Managed Care – PPO | Source: Ambulatory Visit | Attending: Orthopedic Surgery | Admitting: Orthopedic Surgery

## 2020-02-16 ENCOUNTER — Encounter (HOSPITAL_COMMUNITY)
Admission: RE | Admit: 2020-02-16 | Discharge: 2020-02-16 | Disposition: A | Discharge status: Home / Self Care | Payer: BC Managed Care – PPO | Source: Ambulatory Visit | Attending: Orthopedic Surgery | Admitting: Orthopedic Surgery

## 2020-02-16 ENCOUNTER — Other Ambulatory Visit: Payer: Self-pay

## 2020-02-16 DIAGNOSIS — Z01812 Encounter for preprocedural laboratory examination: Secondary | ICD-10-CM | POA: Insufficient documentation

## 2020-02-16 DIAGNOSIS — I10 Essential (primary) hypertension: Secondary | ICD-10-CM | POA: Insufficient documentation

## 2020-02-16 DIAGNOSIS — Z8249 Family history of ischemic heart disease and other diseases of the circulatory system: Secondary | ICD-10-CM | POA: Diagnosis not present

## 2020-02-16 DIAGNOSIS — E119 Type 2 diabetes mellitus without complications: Secondary | ICD-10-CM | POA: Insufficient documentation

## 2020-02-16 DIAGNOSIS — Z01818 Encounter for other preprocedural examination: Secondary | ICD-10-CM | POA: Insufficient documentation

## 2020-02-16 DIAGNOSIS — F4310 Post-traumatic stress disorder, unspecified: Secondary | ICD-10-CM | POA: Insufficient documentation

## 2020-02-16 DIAGNOSIS — Z20822 Contact with and (suspected) exposure to covid-19: Secondary | ICD-10-CM | POA: Insufficient documentation

## 2020-02-16 DIAGNOSIS — Z79899 Other long term (current) drug therapy: Secondary | ICD-10-CM | POA: Diagnosis not present

## 2020-02-16 DIAGNOSIS — Z87891 Personal history of nicotine dependence: Secondary | ICD-10-CM | POA: Insufficient documentation

## 2020-02-16 DIAGNOSIS — M4802 Spinal stenosis, cervical region: Secondary | ICD-10-CM | POA: Diagnosis not present

## 2020-02-16 DIAGNOSIS — G9520 Unspecified cord compression: Secondary | ICD-10-CM | POA: Diagnosis not present

## 2020-02-16 DIAGNOSIS — E781 Pure hyperglyceridemia: Secondary | ICD-10-CM | POA: Insufficient documentation

## 2020-02-16 DIAGNOSIS — Z833 Family history of diabetes mellitus: Secondary | ICD-10-CM | POA: Diagnosis not present

## 2020-02-16 DIAGNOSIS — M5412 Radiculopathy, cervical region: Secondary | ICD-10-CM | POA: Diagnosis present

## 2020-02-16 DIAGNOSIS — Z809 Family history of malignant neoplasm, unspecified: Secondary | ICD-10-CM | POA: Diagnosis not present

## 2020-02-16 HISTORY — DX: Post-traumatic stress disorder, unspecified: F43.10

## 2020-02-16 HISTORY — DX: Essential (primary) hypertension: I10

## 2020-02-16 HISTORY — DX: Male erectile dysfunction, unspecified: N52.9

## 2020-02-16 HISTORY — DX: Type 2 diabetes mellitus without complications: E11.9

## 2020-02-16 HISTORY — DX: Unspecified osteoarthritis, unspecified site: M19.90

## 2020-02-16 LAB — CBC WITH DIFFERENTIAL/PLATELET
Abs Immature Granulocytes: 0.02 10*3/uL (ref 0.00–0.07)
Basophils Absolute: 0 10*3/uL (ref 0.0–0.1)
Basophils Relative: 0 %
Eosinophils Absolute: 0.2 10*3/uL (ref 0.0–0.5)
Eosinophils Relative: 2 %
HCT: 46.1 % (ref 39.0–52.0)
Hemoglobin: 15.9 g/dL (ref 13.0–17.0)
Immature Granulocytes: 0 %
Lymphocytes Relative: 29 %
Lymphs Abs: 2.3 10*3/uL (ref 0.7–4.0)
MCH: 30.5 pg (ref 26.0–34.0)
MCHC: 34.5 g/dL (ref 30.0–36.0)
MCV: 88.3 fL (ref 80.0–100.0)
Monocytes Absolute: 0.5 10*3/uL (ref 0.1–1.0)
Monocytes Relative: 6 %
Neutro Abs: 5 10*3/uL (ref 1.7–7.7)
Neutrophils Relative %: 63 %
Platelets: 181 10*3/uL (ref 150–400)
RBC: 5.22 MIL/uL (ref 4.22–5.81)
RDW: 13 % (ref 11.5–15.5)
WBC: 8 10*3/uL (ref 4.0–10.5)
nRBC: 0 % (ref 0.0–0.2)

## 2020-02-16 LAB — COMPREHENSIVE METABOLIC PANEL
ALT: 22 U/L (ref 0–44)
AST: 21 U/L (ref 15–41)
Albumin: 4.3 g/dL (ref 3.5–5.0)
Alkaline Phosphatase: 93 U/L (ref 38–126)
Anion gap: 11 (ref 5–15)
BUN: 11 mg/dL (ref 6–20)
CO2: 27 mmol/L (ref 22–32)
Calcium: 9.6 mg/dL (ref 8.9–10.3)
Chloride: 101 mmol/L (ref 98–111)
Creatinine, Ser: 1.03 mg/dL (ref 0.61–1.24)
GFR, Estimated: >60 mL/min (ref >60)
Glucose, Bld: 90 mg/dL (ref 70–99)
Potassium: 4.6 mmol/L (ref 3.5–5.1)
Sodium: 139 mmol/L (ref 135–145)
Total Bilirubin: 0.6 mg/dL (ref 0.3–1.2)
Total Protein: 7.3 g/dL (ref 6.5–8.1)

## 2020-02-16 LAB — URINALYSIS, ROUTINE W REFLEX MICROSCOPIC
Bilirubin Urine: NEGATIVE
Glucose, UA: NEGATIVE mg/dL
Hgb urine dipstick: NEGATIVE
Ketones, ur: NEGATIVE mg/dL
Leukocytes,Ua: NEGATIVE
Nitrite: NEGATIVE
Protein, ur: NEGATIVE mg/dL
Specific Gravity, Urine: 1.008 (ref 1.005–1.030)
pH: 7 (ref 5.0–8.0)

## 2020-02-16 LAB — HEMOGLOBIN A1C
Hgb A1c MFr Bld: 4.9 % (ref 4.8–5.6)
Mean Plasma Glucose: 93.93 mg/dL

## 2020-02-16 LAB — TYPE AND SCREEN
ABO/RH(D): O POS
Antibody Screen: NEGATIVE

## 2020-02-16 LAB — SURGICAL PCR SCREEN
MRSA, PCR: NEGATIVE
Staphylococcus aureus: NEGATIVE

## 2020-02-16 LAB — SARS CORONAVIRUS 2 (TAT 6-24 HRS): SARS Coronavirus 2: NEGATIVE

## 2020-02-16 LAB — APTT: aPTT: 32 seconds (ref 24–36)

## 2020-02-16 LAB — GLUCOSE, CAPILLARY: Glucose-Capillary: 97 mg/dL (ref 70–99)

## 2020-02-16 LAB — PROTIME-INR
INR: 1 (ref 0.8–1.2)
Prothrombin Time: 12.7 seconds (ref 11.4–15.2)

## 2020-02-16 NOTE — Progress Notes (Signed)
PCP - DR [facility]            WITH CLEARANCE Cardiologist - NA  Chest x-ray - NA EKG -02/16/20  Stress Test - NA ECHO - NA Cardiac Cath - NA     Aspirin Instructions:STOP  ERAS Protcol -YES PRE-SURGERY Ensure or G2- WATR GIVEN  COVID TEST- 02/16/20   Anesthesia review: ESSENTIAL HTN  Patient denies shortness of breath, fever, cough and chest pain at PAT appointment   All instructions explained to the patient, with a verbal understanding of the material. Patient agrees to go over the instructions while at home for a better understanding. Patient also instructed to self quarantine after being tested for COVID-19. The opportunity to ask questions was provided.

## 2020-02-17 NOTE — Progress Notes (Signed)
Anesthesia Chart Review:  Case: [id number] Date/Time: 02/19/20 1145   Procedure: ANTERIOR CERVICAL DECOMPRESSION FUSION CERVICAL 6-CERVICAL 7 WITH INSTRUMENTATION AND ALLOGRAFT (N/A ) - 3C BED   Anesthesia type: General   Pre-op diagnosis: PROGRESSIVE SYMPTOMATOLOGY CONCERNING FOR PROGRESSIVE CERVICAL MYELORADICULOPATHY   Location: [facility] OR ROOM 04 / [facility] OR   Surgeons: Dumonski, Mark, MD      DISCUSSION: Patient is a 45-year-old male scheduled for the above procedure.  History includes former smoker (quit 12/23/16), HTN, DM2, hypertriglyceridemia, PTSD.  PAT Instructions included to hold phentermine for surgery.  02/16/2020 presurgical COVID-19 test negative.  Anesthesia team to evaluate on the day of surgery.   VS: BP (!) 124/91   Pulse 85   Temp 36.6 C (Oral)   Resp 18   Ht 5' 11" (1.803 m)   Wt 79.2 kg   SpO2 100%   BMI 24.34 kg/m   PROVIDERS: Thekkekandam, Thomas J, MD is PCP   LABS: Labs reviewed: Acceptable for surgery. (all labs ordered are listed, but only abnormal results are displayed)  Labs Reviewed  URINALYSIS, ROUTINE W REFLEX MICROSCOPIC - Abnormal; Notable for the following components:      Result Value   Color, Urine STRAW (*)    All other components within normal limits  SURGICAL PCR SCREEN  GLUCOSE, CAPILLARY  CBC WITH DIFFERENTIAL/PLATELET  COMPREHENSIVE METABOLIC PANEL  PROTIME-INR  APTT  HEMOGLOBIN A1C  TYPE AND SCREEN     IMAGES: MRI C-spine 01/16/20 ([facility]): Impression:  Multilevel spondylosis as described above, resulting in moderate central canal stenosis at C6-C7 and mild foraminal narrowing at C6-C7 (left).    EKG: 02/16/20: NSR   CV: N/A   Past Medical History:  Diagnosis Date  . Arthritis   . Diabetes mellitus without complication (HCC)   . ED (erectile dysfunction)   . Hypertension   . Hypertriglyceridemia 01/28/2016  . PTSD (post-traumatic stress disorder)     Past Surgical History:  Procedure Laterality Date  . NO  PAST SURGERIES      MEDICATIONS: . Accu-Chek FastClix Lancets MISC  . ACCU-CHEK GUIDE test strip  . ALPRAZolam (XANAX) 0.5 MG tablet  . AMBULATORY NON FORMULARY MEDICATION  . Blood Glucose Monitoring Suppl (ACCU-CHEK GUIDE) w/Device KIT  . cyclobenzaprine (FLEXERIL) 5 MG tablet  . fenofibrate 160 MG tablet  . fexofenadine (ALLEGRA) 180 MG tablet  . ibuprofen (ADVIL) 200 MG tablet  . meloxicam (MOBIC) 15 MG tablet  . methocarbamol (ROBAXIN) 500 MG tablet  . OZEMPIC, 1 MG/DOSE, 4 MG/3ML SOPN  . phentermine (ADIPEX-P) 37.5 MG tablet   No current facility-administered medications for this encounter.    Allison Zelenak, PA-C Surgical Short Stay/Anesthesiology [facility] Phone [phone] [facility] Phone [phone] 02/17/2020 3:12 PM        

## 2020-02-17 NOTE — Anesthesia Preprocedure Evaluation (Signed)
Anesthesia Evaluation  Patient identified by MRN, date of birth, ID band Patient awake    Reviewed: Allergy & Precautions, NPO status , Patient's Chart, lab work & pertinent test results  Airway Mallampati: II       Dental  (+) Teeth Intact, Dental Advisory Given   Pulmonary former smoker,    breath sounds clear to auscultation       Cardiovascular hypertension,  Rhythm:Regular Rate:Normal     Neuro/Psych PSYCHIATRIC DISORDERS Anxiety  Neuromuscular disease    GI/Hepatic negative GI ROS, Neg liver ROS,   Endo/Other  diabetes  Renal/GU negative Renal ROS     Musculoskeletal  (+) Arthritis ,   Abdominal Normal abdominal exam  (+)   Peds  Hematology negative hematology ROS (+)   Anesthesia Other Findings Bilateral hand numbness  Reproductive/Obstetrics                           Anesthesia Physical Anesthesia Plan  ASA: II  Anesthesia Plan: General   Post-op Pain Management:    Induction: Intravenous  PONV Risk Score and Plan: 3 and Ondansetron, Dexamethasone and Midazolam  Airway Management Planned: Oral ETT and Video Laryngoscope Planned  Additional Equipment: None  Intra-op Plan:   Post-operative Plan: Extubation in OR  Informed Consent:   Plan Discussed with: CRNA  Anesthesia Plan Comments: (PAT note written 02/17/2020 by Shonna Chock, PA-C. )       Anesthesia Quick Evaluation

## 2020-02-19 ENCOUNTER — Other Ambulatory Visit: Payer: Self-pay

## 2020-02-19 ENCOUNTER — Encounter (HOSPITAL_COMMUNITY): Payer: Self-pay | Admitting: Orthopedic Surgery

## 2020-02-19 ENCOUNTER — Encounter (HOSPITAL_COMMUNITY)
Admission: RE | Disposition: A | Discharge status: Home / Self Care | Payer: Self-pay | Source: Home / Self Care | Attending: Orthopedic Surgery

## 2020-02-19 ENCOUNTER — Ambulatory Visit (HOSPITAL_COMMUNITY): Payer: BC Managed Care – PPO

## 2020-02-19 ENCOUNTER — Ambulatory Visit (HOSPITAL_COMMUNITY): Payer: BC Managed Care – PPO | Admitting: Vascular Surgery

## 2020-02-19 ENCOUNTER — Ambulatory Visit (HOSPITAL_COMMUNITY): Payer: BC Managed Care – PPO | Admitting: Certified Registered Nurse Anesthetist

## 2020-02-19 ENCOUNTER — Ambulatory Visit (HOSPITAL_COMMUNITY)
Admission: RE | Admit: 2020-02-19 | Discharge: 2020-02-19 | Disposition: A | Discharge status: Home / Self Care | Payer: BC Managed Care – PPO | Attending: Orthopedic Surgery | Admitting: Orthopedic Surgery

## 2020-02-19 DIAGNOSIS — M5412 Radiculopathy, cervical region: Secondary | ICD-10-CM

## 2020-02-19 DIAGNOSIS — Z20822 Contact with and (suspected) exposure to covid-19: Secondary | ICD-10-CM | POA: Insufficient documentation

## 2020-02-19 DIAGNOSIS — Z833 Family history of diabetes mellitus: Secondary | ICD-10-CM | POA: Insufficient documentation

## 2020-02-19 DIAGNOSIS — M4802 Spinal stenosis, cervical region: Secondary | ICD-10-CM | POA: Diagnosis not present

## 2020-02-19 DIAGNOSIS — Z419 Encounter for procedure for purposes other than remedying health state, unspecified: Secondary | ICD-10-CM

## 2020-02-19 DIAGNOSIS — Z79899 Other long term (current) drug therapy: Secondary | ICD-10-CM | POA: Insufficient documentation

## 2020-02-19 DIAGNOSIS — G9520 Unspecified cord compression: Secondary | ICD-10-CM | POA: Insufficient documentation

## 2020-02-19 DIAGNOSIS — Z8249 Family history of ischemic heart disease and other diseases of the circulatory system: Secondary | ICD-10-CM | POA: Insufficient documentation

## 2020-02-19 DIAGNOSIS — Z87891 Personal history of nicotine dependence: Secondary | ICD-10-CM | POA: Insufficient documentation

## 2020-02-19 DIAGNOSIS — Z809 Family history of malignant neoplasm, unspecified: Secondary | ICD-10-CM | POA: Insufficient documentation

## 2020-02-19 HISTORY — PX: ANTERIOR CERVICAL DECOMP/DISCECTOMY FUSION: SHX1161

## 2020-02-19 LAB — GLUCOSE, CAPILLARY
Glucose-Capillary: 91 mg/dL (ref 70–99)
Glucose-Capillary: 75 mg/dL (ref 70–99)
Glucose-Capillary: 89 mg/dL (ref 70–99)

## 2020-02-19 LAB — ABO/RH: ABO/RH(D): O POS

## 2020-02-19 SURGERY — ANTERIOR CERVICAL DECOMPRESSION/DISCECTOMY FUSION 1 LEVEL
Anesthesia: General | Site: Spine Cervical

## 2020-02-19 MED ORDER — MIDAZOLAM HCL 5 MG/5ML IJ SOLN
INTRAMUSCULAR | Status: DC | PRN
  Administered 2020-02-19: 2 mg via INTRAVENOUS

## 2020-02-19 MED ORDER — CEFAZOLIN SODIUM-DEXTROSE 2-4 GM/100ML-% IV SOLN
2.0000 g | INTRAVENOUS | Status: AC
  Administered 2020-02-19: 2 g via INTRAVENOUS
  Filled 2020-02-19: qty 100

## 2020-02-19 MED ORDER — OXYCODONE-ACETAMINOPHEN 5-325 MG PO TABS: 1.0000 | ORAL_TABLET | ORAL | 0 refills | Status: AC | PRN

## 2020-02-19 MED ORDER — DIPHENHYDRAMINE HCL 50 MG/ML IJ SOLN
INTRAMUSCULAR | Status: DC | PRN
  Administered 2020-02-19: 12.5 mg via INTRAVENOUS

## 2020-02-19 MED ORDER — PROPOFOL 10 MG/ML IV BOLUS
INTRAVENOUS | Status: AC
  Filled 2020-02-19: qty 40

## 2020-02-19 MED ORDER — DEXAMETHASONE SODIUM PHOSPHATE 10 MG/ML IJ SOLN
INTRAMUSCULAR | Status: DC | PRN
  Administered 2020-02-19: 10 mg via INTRAVENOUS

## 2020-02-19 MED ORDER — FENTANYL CITRATE (PF) 250 MCG/5ML IJ SOLN
INTRAMUSCULAR | Status: AC
  Filled 2020-02-19: qty 5

## 2020-02-19 MED ORDER — ROCURONIUM BROMIDE 10 MG/ML (PF) SYRINGE
PREFILLED_SYRINGE | INTRAVENOUS | Status: AC
  Filled 2020-02-19: qty 10

## 2020-02-19 MED ORDER — HEMOSTATIC AGENTS (NO CHARGE) OPTIME
TOPICAL | Status: DC | PRN
  Administered 2020-02-19: 1 via TOPICAL

## 2020-02-19 MED ORDER — ROCURONIUM BROMIDE 100 MG/10ML IV SOLN
INTRAVENOUS | Status: DC | PRN
  Administered 2020-02-19: 10 mg via INTRAVENOUS
  Administered 2020-02-19: 60 mg via INTRAVENOUS

## 2020-02-19 MED ORDER — KETAMINE HCL 10 MG/ML IJ SOLN
INTRAMUSCULAR | Status: DC | PRN
  Administered 2020-02-19 (×5): 10 mg via INTRAVENOUS

## 2020-02-19 MED ORDER — FENTANYL CITRATE (PF) 100 MCG/2ML IJ SOLN
INTRAMUSCULAR | Status: DC | PRN
  Administered 2020-02-19 (×5): 50 ug via INTRAVENOUS

## 2020-02-19 MED ORDER — MIDAZOLAM HCL 2 MG/2ML IJ SOLN
INTRAMUSCULAR | Status: AC
  Filled 2020-02-19: qty 2

## 2020-02-19 MED ORDER — LACTATED RINGERS IV SOLN: INTRAVENOUS | Status: DC

## 2020-02-19 MED ORDER — ONDANSETRON HCL 4 MG/2ML IJ SOLN
INTRAMUSCULAR | Status: AC
  Filled 2020-02-19: qty 2

## 2020-02-19 MED ORDER — STERILE WATER FOR IRRIGATION IR SOLN
Status: DC | PRN
  Administered 2020-02-19: 1000 mL

## 2020-02-19 MED ORDER — BUPIVACAINE-EPINEPHRINE (PF) 0.25% -1:200000 IJ SOLN
INTRAMUSCULAR | Status: AC
  Filled 2020-02-19: qty 30

## 2020-02-19 MED ORDER — THROMBIN 20000 UNITS EX SOLR
CUTANEOUS | Status: DC | PRN
  Administered 2020-02-19: 20000 [IU] via TOPICAL

## 2020-02-19 MED ORDER — HYDROMORPHONE HCL 1 MG/ML IJ SOLN
INTRAMUSCULAR | Status: AC
  Filled 2020-02-19: qty 1

## 2020-02-19 MED ORDER — PROPOFOL 10 MG/ML IV BOLUS
INTRAVENOUS | Status: DC | PRN
  Administered 2020-02-19: 140 mg via INTRAVENOUS

## 2020-02-19 MED ORDER — SUGAMMADEX SODIUM 200 MG/2ML IV SOLN
INTRAVENOUS | Status: DC | PRN
  Administered 2020-02-19: 150 mg via INTRAVENOUS

## 2020-02-19 MED ORDER — PHENYLEPHRINE 40 MCG/ML (10ML) SYRINGE FOR IV PUSH (FOR BLOOD PRESSURE SUPPORT)
PREFILLED_SYRINGE | INTRAVENOUS | Status: DC | PRN
  Administered 2020-02-19 (×3): 40 ug via INTRAVENOUS

## 2020-02-19 MED ORDER — DEXAMETHASONE SODIUM PHOSPHATE 10 MG/ML IJ SOLN
INTRAMUSCULAR | Status: AC
  Filled 2020-02-19: qty 1

## 2020-02-19 MED ORDER — DIPHENHYDRAMINE HCL 50 MG/ML IJ SOLN
INTRAMUSCULAR | Status: AC
  Filled 2020-02-19: qty 1

## 2020-02-19 MED ORDER — LIDOCAINE 2% (20 MG/ML) 5 ML SYRINGE
INTRAMUSCULAR | Status: AC
  Filled 2020-02-19: qty 5

## 2020-02-19 MED ORDER — KETAMINE HCL 50 MG/5ML IJ SOSY
PREFILLED_SYRINGE | INTRAMUSCULAR | Status: AC
  Filled 2020-02-19: qty 5

## 2020-02-19 MED ORDER — POVIDONE-IODINE 7.5 % EX SOLN: Freq: Once | CUTANEOUS | Status: DC

## 2020-02-19 MED ORDER — THROMBIN 20000 UNITS EX SOLR
CUTANEOUS | Status: AC
  Filled 2020-02-19: qty 20000

## 2020-02-19 MED ORDER — HYDROMORPHONE HCL 1 MG/ML IJ SOLN
0.2500 mg | INTRAMUSCULAR | Status: DC | PRN
  Administered 2020-02-19 (×3): 0.5 mg via INTRAVENOUS

## 2020-02-19 MED ORDER — OXYCODONE-ACETAMINOPHEN 5-325 MG PO TABS
ORAL_TABLET | ORAL | Status: AC
  Filled 2020-02-19: qty 1

## 2020-02-19 MED ORDER — LIDOCAINE 2% (20 MG/ML) 5 ML SYRINGE
INTRAMUSCULAR | Status: DC | PRN
  Administered 2020-02-19: 40 mg via INTRAVENOUS

## 2020-02-19 MED ORDER — ORAL CARE MOUTH RINSE: 15.0000 mL | Freq: Once | OROMUCOSAL | Status: AC

## 2020-02-19 MED ORDER — PHENYLEPHRINE 40 MCG/ML (10ML) SYRINGE FOR IV PUSH (FOR BLOOD PRESSURE SUPPORT)
PREFILLED_SYRINGE | INTRAVENOUS | Status: AC
  Filled 2020-02-19: qty 10

## 2020-02-19 MED ORDER — BUPIVACAINE-EPINEPHRINE 0.25% -1:200000 IJ SOLN
INTRAMUSCULAR | Status: DC | PRN
  Administered 2020-02-19: 4.5 mL

## 2020-02-19 MED ORDER — ONDANSETRON HCL 4 MG/2ML IJ SOLN
INTRAMUSCULAR | Status: DC | PRN
  Administered 2020-02-19: 4 mg via INTRAVENOUS

## 2020-02-19 MED ORDER — CHLORHEXIDINE GLUCONATE 0.12 % MT SOLN
15.0000 mL | Freq: Once | OROMUCOSAL | Status: AC
  Administered 2020-02-19: 15 mL via OROMUCOSAL
  Filled 2020-02-19: qty 15

## 2020-02-19 MED ORDER — OXYCODONE-ACETAMINOPHEN 5-325 MG PO TABS
1.0000 | ORAL_TABLET | Freq: Once | ORAL | Status: AC
  Administered 2020-02-19: 1 via ORAL

## 2020-02-19 SURGICAL SUPPLY — 84 items
AGENT HMST KT MTR STRL THRMB (HEMOSTASIS)
APL SKNCLS STERI-STRIP NONHPOA (GAUZE/BANDAGES/DRESSINGS) ×1
BENZOIN TINCTURE PRP APPL 2/3 (GAUZE/BANDAGES/DRESSINGS) ×2 IMPLANT
BIT DRILL NEURO 2X3.1 SFT TUCH (MISCELLANEOUS) ×1 IMPLANT
BIT DRILL SRG 14X2.2XFLT CHK (BIT) IMPLANT
BIT DRL SRG 14X2.2XFLT CHK (BIT) ×1
BLADE CLIPPER SURG (BLADE) ×2 IMPLANT
BLADE SURG 15 STRL LF DISP TIS (BLADE) ×1 IMPLANT
BLADE SURG 15 STRL SS (BLADE) ×2
BONE VIVIGEN FORMABLE 1.3CC (Bone Implant) ×2 IMPLANT
CARTRIDGE OIL MAESTRO DRILL (MISCELLANEOUS) ×1 IMPLANT
CORD BIPOLAR FORCEPS 12FT (ELECTRODE) ×2 IMPLANT
COVER SURGICAL LIGHT HANDLE (MISCELLANEOUS) ×2 IMPLANT
COVER WAND RF STERILE (DRAPES) ×1 IMPLANT
DIFFUSER DRILL AIR PNEUMATIC (MISCELLANEOUS) ×2 IMPLANT
DRAIN JACKSON RD 7FR 3/32 (WOUND CARE) IMPLANT
DRAPE C-ARM 42X72 X-RAY (DRAPES) ×2 IMPLANT
DRAPE POUCH INSTRU U-SHP 10X18 (DRAPES) ×2 IMPLANT
DRAPE SURG 17X23 STRL (DRAPES) ×6 IMPLANT
DRILL BIT SKYLINE 14MM (BIT) ×2
DRILL NEURO 2X3.1 SOFT TOUCH (MISCELLANEOUS) ×2
DURAPREP 26ML APPLICATOR (WOUND CARE) ×2 IMPLANT
ELECT COATED BLADE 2.86 ST (ELECTRODE) ×2 IMPLANT
ELECT REM PT RETURN 9FT ADLT (ELECTROSURGICAL) ×2
ELECTRODE REM PT RTRN 9FT ADLT (ELECTROSURGICAL) ×1 IMPLANT
EVACUATOR SILICONE 100CC (DRAIN) IMPLANT
GAUZE 4X4 16PLY RFD (DISPOSABLE) ×2 IMPLANT
GAUZE SPONGE 4X4 12PLY STRL (GAUZE/BANDAGES/DRESSINGS) ×2 IMPLANT
GAUZE SPONGE 4X4 12PLY STRL LF (GAUZE/BANDAGES/DRESSINGS) ×1 IMPLANT
GLOVE BIO SURGEON STRL SZ7 (GLOVE) ×2 IMPLANT
GLOVE BIO SURGEON STRL SZ8 (GLOVE) ×2 IMPLANT
GLOVE BIOGEL PI IND STRL 7.0 (GLOVE) ×2 IMPLANT
GLOVE BIOGEL PI IND STRL 8 (GLOVE) ×1 IMPLANT
GLOVE BIOGEL PI INDICATOR 7.0 (GLOVE) ×2
GLOVE BIOGEL PI INDICATOR 8 (GLOVE) ×1
GOWN STRL REUS W/ TWL LRG LVL3 (GOWN DISPOSABLE) ×1 IMPLANT
GOWN STRL REUS W/ TWL XL LVL3 (GOWN DISPOSABLE) ×1 IMPLANT
GOWN STRL REUS W/TWL LRG LVL3 (GOWN DISPOSABLE) ×2
GOWN STRL REUS W/TWL XL LVL3 (GOWN DISPOSABLE) ×2
GRAFT BNE MATRIX VG FRMBL SM 1 (Bone Implant) IMPLANT
INTERLOCK LRDTC CRVCL VBR 8MM (Peek) IMPLANT
IV CATH 14GX2 1/4 (CATHETERS) ×2 IMPLANT
KIT BASIN OR (CUSTOM PROCEDURE TRAY) ×2 IMPLANT
KIT TURNOVER KIT B (KITS) ×2 IMPLANT
LORDOTIC CERVICAL VBR 8MM SM (Peek) ×2 IMPLANT
MANIFOLD NEPTUNE II (INSTRUMENTS) ×1 IMPLANT
NDL 18GX1X1/2 (RX/OR ONLY) (NEEDLE) IMPLANT
NDL PRECISIONGLIDE 27X1.5 (NEEDLE) ×1 IMPLANT
NDL SPNL 18GX3.5 QUINCKE PK (NEEDLE) IMPLANT
NDL SPNL 20GX3.5 QUINCKE YW (NEEDLE) ×1 IMPLANT
NDL SPNL 22GX3.5 QUINCKE BK (NEEDLE) IMPLANT
NEEDLE 18GX1X1/2 (RX/OR ONLY) (NEEDLE) ×2 IMPLANT
NEEDLE PRECISIONGLIDE 27X1.5 (NEEDLE) ×2 IMPLANT
NEEDLE SPNL 18GX3.5 QUINCKE PK (NEEDLE) ×2 IMPLANT
NEEDLE SPNL 20GX3.5 QUINCKE YW (NEEDLE) ×2 IMPLANT
NEEDLE SPNL 22GX3.5 QUINCKE BK (NEEDLE) ×2 IMPLANT
NS IRRIG 1000ML POUR BTL (IV SOLUTION) ×2 IMPLANT
OIL CARTRIDGE MAESTRO DRILL (MISCELLANEOUS) ×2
PACK ORTHO CERVICAL (CUSTOM PROCEDURE TRAY) ×2 IMPLANT
PAD ARMBOARD 7.5X6 YLW CONV (MISCELLANEOUS) ×4 IMPLANT
PATTIES SURGICAL .5 X.5 (GAUZE/BANDAGES/DRESSINGS) ×1 IMPLANT
PATTIES SURGICAL .5 X1 (DISPOSABLE) IMPLANT
PIN DISTRACTION 14 (PIN) ×2 IMPLANT
PLATE SKYLINE 12MM (Plate) ×1 IMPLANT
POSITIONER HEAD DONUT 9IN (MISCELLANEOUS) ×2 IMPLANT
SCREW SKYLINE VAR OS 14MM (Screw) ×4 IMPLANT
SPONGE INTESTINAL PEANUT (DISPOSABLE) ×2 IMPLANT
SPONGE SURGIFOAM ABS GEL 100 (HEMOSTASIS) IMPLANT
SPONGE SURGIFOAM ABS GEL SZ50 (HEMOSTASIS) ×1 IMPLANT
STRIP CLOSURE SKIN 1/2X4 (GAUZE/BANDAGES/DRESSINGS) ×2 IMPLANT
SURGIFLO W/THROMBIN 8M KIT (HEMOSTASIS) IMPLANT
SUT MNCRL AB 4-0 PS2 18 (SUTURE) ×2 IMPLANT
SUT SILK 4 0 (SUTURE)
SUT SILK 4-0 18XBRD TIE 12 (SUTURE) IMPLANT
SUT VIC AB 2-0 CT2 18 VCP726D (SUTURE) ×2 IMPLANT
SYR 10ML LL (SYRINGE) ×1 IMPLANT
SYR BULB IRRIG 60ML STRL (SYRINGE) ×2 IMPLANT
SYR CONTROL 10ML LL (SYRINGE) ×4 IMPLANT
TAPE CLOTH 4X10 WHT NS (GAUZE/BANDAGES/DRESSINGS) ×2 IMPLANT
TAPE UMBILICAL COTTON 1/8X30 (MISCELLANEOUS) ×2 IMPLANT
TOWEL GREEN STERILE (TOWEL DISPOSABLE) ×2 IMPLANT
TOWEL GREEN STERILE FF (TOWEL DISPOSABLE) ×2 IMPLANT
WATER STERILE IRR 1000ML POUR (IV SOLUTION) ×2 IMPLANT
YANKAUER SUCT BULB TIP NO VENT (SUCTIONS) ×2 IMPLANT

## 2020-02-19 NOTE — Op Note (Signed)
PATIENT NAME: Carl Griffith   MEDICAL RECORD NO.:   [id number]    DATE OF BIRTH: [birth year]   DATE OF PROCEDURE: 02/19/2020                               OPERATIVE REPORT     PREOPERATIVE DIAGNOSES: 1. Bilateral cervical radiculopathy. 2. Spinal stenosis, C6/7, including spinal cord compression and bilateral neuroforaminal stenosis   POSTOPERATIVE DIAGNOSES: 1. Bilateral cervical radiculopathy. 2. Spinal stenosis, C6/7, including spinal cord compression and bilateral neuroforaminal stenosis   PROCEDURE: 1. Anterior cervical decompression and fusion C6/7. 2. Placement of anterior instrumentation, C6/7. 3. Insertion of interbody device x1 (6mm Titan intervertebral spacer). 4. Intraoperative use of fluoroscopy. 5. Use of morselized allograft - ViviGen.   SURGEON:  Estill Bamberg, MD   ASSISTANT:  Jason Coop, PA-C.   ANESTHESIA:  General endotracheal anesthesia.   COMPLICATIONS:  None.   DISPOSITION:  Stable.   ESTIMATED BLOOD LOSS:  Minimal.   INDICATIONS FOR SURGERY:  Briefly, Carl Griffith is a pleasant 45 -year- old male, who did present to me with severe pain in his bilateral arms.   The patient's MRI did reveal the findings noted above.  Given the ongoing rather debilitating pain and lack of improvement with appropriate treatment measures, we did discuss proceeding with the procedure noted above.  The patient was fully aware of the risks and limitations of surgery as outlined in my preoperative note.   OPERATIVE DETAILS:  On 02/19/2020, the patient was brought to surgery and general endotracheal anesthesia was administered.  The patient was placed supine on the hospital bed. The neck was gently extended.  All bony prominences were meticulously padded.  The neck was prepped and draped in the usual sterile fashion.  At this point, I did make a left-sided transverse incision.  The platysma was incised.  A Smith-Robinson approach was used and the anterior spine was  identified. A self-retaining retractor was placed.  I then subperiosteally exposed the vertebral bodies from C6-C7. Caspar pins were then placed into the C6 and C7 vertebral bodies and distraction was applied.  A thorough and complete C6-7 intervertebral diskectomy was performed.  The posterior longitudinal ligament was identified and entered using a nerve hook.  I then used #1 followed by #2 Kerrison to perform a thorough and complete intervertebral diskectomy.  The spinal canal was thoroughly decompressed, as was the left and right neuroforamen. The endplates were then prepared and the appropriate-sized intervertebral spacer was then packed with ViviGen and tamped into position in the usual fashion. The Caspar pins  then were removed and bone wax was placed in their place.  The appropriate-sized anterior cervical plate was placed over the anterior spine. 14 mm variable angle screws were placed, 2 in each vertebral body from C6-C7 for a total of 4 vertebral body screws.  The screws were then locked to the plate using the Cam locking mechanism.  I was very pleased with the final fluoroscopic images.  The wound was then irrigated.  The wound was then explored for any undue bleeding and there was no bleeding noted. The wound was then closed in layers using 2-0 Vicryl, followed by 4-0 Monocryl.  Benzoin and Steri-Strips were applied, followed by sterile dressing.  All instrument counts were correct at the termination of the procedure.   Of note, Jason Coop, PA-C, was my assistant throughout surgery, and did aid in retraction, placement of the hardware, suctioning, and  closure from start to finish.     Estill Bamberg, MD

## 2020-02-19 NOTE — H&P (Signed)
PREOPERATIVE H&P  Chief Complaint: Bilateral arm pain  HPI: Carl Griffith is a 45 y.o. male who presents with ongoing pain in the bilateral arms  MRI reveals a C6/7 disc protrusion, minimally deflecting the spinal cord, also compressingthe bilateral C7 nerves, left greater than right  Patient has failed multiple forms of conservative care and continues to have pain (see office notes for additional details regarding the patient's full course of treatment)  Past Medical History:  Diagnosis Date  . Arthritis   . Diabetes mellitus without complication ([facility])   . ED (erectile dysfunction)   . Hypertension   . Hypertriglyceridemia 01/28/2016  . PTSD (post-traumatic stress disorder)    Past Surgical History:  Procedure Laterality Date  . NO PAST SURGERIES     Social History   Socioeconomic History  . Marital status: Married    Spouse name: Not on file  . Number of children: Not on file  . Years of education: Not on file  . Highest education level: Not on file  Occupational History  . Not on file  Tobacco Use  . Smoking status: Former Smoker    Packs/day: 1.50    Years: 15.00    Pack years: 22.50    Quit date: 12/23/2016    Years since quitting: 3.1  . Smokeless tobacco: Never Used  Substance and Sexual Activity  . Alcohol use: No  . Drug use: No  . Sexual activity: Yes    Partners: Female    Birth control/protection: None  Other Topics Concern  . Not on file  Social History Narrative  . Not on file   Social Determinants of Health   Financial Resource Strain: Not on file  Food Insecurity: Not on file  Transportation Needs: Not on file  Physical Activity: Not on file  Stress: Not on file  Social Connections: Not on file   Family History  Problem Relation Age of Onset  . Diabetes Mother   . Hypertension Mother   . Heart attack Mother   . Cancer Maternal Grandmother   . Hypertension Maternal Grandmother   . Diabetes Maternal Grandmother   . Heart  attack Maternal Grandmother   . Hypertension Maternal Grandfather   . Heart attack Maternal Grandfather    No Known Allergies Prior to Admission medications   Medication Sig Start Date End Date Taking? Authorizing Provider  cyclobenzaprine (FLEXERIL) 5 MG tablet Take 5 mg by mouth at bedtime as needed (neck pain). 01/23/20  Yes [provider]  fenofibrate 160 MG tablet TAKE 1 TABLET (160 MG TOTAL) BY MOUTH DAILY. 05/17/19  Yes Silverio Decamp, MD  fexofenadine (ALLEGRA) 180 MG tablet Take 180 mg by mouth daily.   Yes [provider]  ibuprofen (ADVIL) 200 MG tablet Take 600 mg by mouth every 8 (eight) hours as needed (headache.).   Yes [provider]  meloxicam (MOBIC) 15 MG tablet TAKE ONE TABLET BY MOUTH EACH MORNING WITH BREAKFAST FOR 2 WEEKS, THEN TAKE DAILY ONLY AS NEEDED Patient taking differently: Take 15 mg by mouth daily as needed for pain. 07/21/16  Yes Silverio Decamp, MD  methocarbamol (ROBAXIN) 500 MG tablet Take 500 mg by mouth 4 (four) times daily as needed (neck pain.). 12/26/19  Yes [provider]  OZEMPIC, 1 MG/DOSE, 4 MG/3ML SOPN INJECT 1 MG INTO SKIN ONCE WEEKLY Patient taking differently: Inject 1 mg into the skin every Sunday. 12/01/19  Yes Silverio Decamp, MD  Accu-Chek FastClix Lancets MISC USE  AS DIRECTED TO TEST BLOOD SUGAR 09/10/18   [provider]  ACCU-CHEK GUIDE test strip  12/06/18   [provider]  ALPRAZolam Duanne Moron) 0.5 MG tablet Take 1 tablet (0.5 mg total) by mouth 3 (three) times daily as needed for anxiety. 10/27/19   Silverio Decamp, MD  AMBULATORY NON FORMULARY MEDICATION Single glucometer with lancets, test strips. Disp qs 3 months 09/09/18   Gregor Hams, MD  Blood Glucose Monitoring Suppl (ACCU-CHEK GUIDE) w/Device KIT USE AS DIRECTED TO TEST BLOOD SUGAR 09/10/18   [provider]  phentermine (ADIPEX-P) 37.5 MG tablet  11/15/17   [provider]     All  other systems have been reviewed and were otherwise negative with the exception of those mentioned in the HPI and as above.  Physical Exam: There were no vitals filed for this visit.  There is no height or weight on file to calculate BMI.  General: Alert, no acute distress Cardiovascular: No pedal edema Respiratory: No cyanosis, no use of accessory musculature Skin: No lesions in the area of chief complaint Neurologic: Sensation intact distally Psychiatric: Patient is competent for consent with normal mood and affect Lymphatic: No axillary or cervical lymphadenopathy   Assessment/Plan: PROGRESSIVE CERVICAL MYELORADICULOPATHY Plan for Procedure(s): ANTERIOR CERVICAL DECOMPRESSION FUSION CERVICAL 6-CERVICAL 7 WITH INSTRUMENTATION AND ALLOGRAFT   Norva Karvonen, MD 02/19/2020 8:47 AM

## 2020-02-19 NOTE — Anesthesia Postprocedure Evaluation (Signed)
Anesthesia Post Note  Patient: Carl Griffith  Procedure(s) Performed: ANTERIOR CERVICAL DECOMPRESSION FUSION CERVICAL 6-CERVICAL 7 WITH INSTRUMENTATION AND ALLOGRAFT (N/A Spine Cervical)     Patient location during evaluation: PACU Anesthesia Type: General Level of consciousness: awake and alert Pain management: pain level controlled Vital Signs Assessment: post-procedure vital signs reviewed and stable Respiratory status: spontaneous breathing, nonlabored ventilation and respiratory function stable Cardiovascular status: blood pressure returned to baseline and stable Postop Assessment: no apparent nausea or vomiting Anesthetic complications: no   No complications documented.  Last Vitals:  Vitals:   02/19/20 1502 02/19/20 1517  BP: (!) 139/98 (!) 159/91  Pulse: 95 96  Resp: 13 17  Temp:  (!) 36.2 C  SpO2: 92% 99%    Last Pain:  Vitals:   02/19/20 1415  TempSrc:   PainSc: 0-No pain                 Annelie Boak,W. EDMOND

## 2020-02-19 NOTE — Transfer of Care (Signed)
Immediate Anesthesia Transfer of Care Note  Patient: Carl Griffith  Procedure(s) Performed: ANTERIOR CERVICAL DECOMPRESSION FUSION CERVICAL 6-CERVICAL 7 WITH INSTRUMENTATION AND ALLOGRAFT (N/A Spine Cervical)  Patient Location: PACU  Anesthesia Type:General  Level of Consciousness: drowsy  Airway & Oxygen Therapy: Patient Spontanous Breathing and Patient connected to face mask oxygen  Post-op Assessment: Report given to RN and Post -op Vital signs reviewed and stable  Post vital signs: Reviewed and stable  Last Vitals:  Vitals Value Taken Time  BP 160/91 02/19/20 1416  Temp    Pulse 104 02/19/20 1419  Resp 17 02/19/20 1419  SpO2 99 % 02/19/20 1419  Vitals shown include unvalidated device data.  Last Pain:  Vitals:   02/19/20 0920  TempSrc:   PainSc: 3          Complications: No complications documented.

## 2020-02-19 NOTE — Anesthesia Procedure Notes (Signed)
Procedure Name: Intubation Date/Time: 02/19/2020 12:20 PM Performed by: Marny Lowenstein, CRNA Pre-anesthesia Checklist: Patient identified, Emergency Drugs available, Suction available and Patient being monitored Patient Re-evaluated:Patient Re-evaluated prior to induction Oxygen Delivery Method: Circle system utilized Preoxygenation: Pre-oxygenation with 100% oxygen Induction Type: IV induction Ventilation: Mask ventilation without difficulty and Oral airway inserted - appropriate to patient size Laryngoscope Size: Glidescope and 3 Grade View: Grade I Tube type: Oral Tube size: 7.5 mm Number of attempts: 1 Airway Equipment and Method: Rigid stylet and Video-laryngoscopy Placement Confirmation: ETT inserted through vocal cords under direct vision,  positive ETCO2 and breath sounds checked- equal and bilateral Secured at: 23 cm Tube secured with: Tape Dental Injury: Teeth and Oropharynx as per pre-operative assessment

## 2020-02-20 ENCOUNTER — Encounter (HOSPITAL_COMMUNITY): Payer: Self-pay | Admitting: Orthopedic Surgery

## 2020-05-10 ENCOUNTER — Encounter: Payer: Self-pay | Admitting: Physician Assistant

## 2020-05-10 ENCOUNTER — Ambulatory Visit: Payer: BC Managed Care – PPO | Admitting: Physician Assistant

## 2020-05-10 VITALS — BP 128/89 | HR 92 | Ht 71.0 in | Wt 185.0 lb

## 2020-05-10 DIAGNOSIS — H6122 Impacted cerumen, left ear: Secondary | ICD-10-CM | POA: Diagnosis not present

## 2020-05-10 DIAGNOSIS — H6983 Other specified disorders of Eustachian tube, bilateral: Secondary | ICD-10-CM

## 2020-05-10 MED ORDER — METHYLPREDNISOLONE 4 MG PO TBPK
ORAL_TABLET | ORAL | 0 refills | Status: DC
Start: 2020-05-10 — End: 2021-05-06

## 2020-05-10 NOTE — Patient Instructions (Signed)

## 2020-05-10 NOTE — Progress Notes (Signed)
Subjective:    Patient ID: Carl Griffith, male    DOB: 07-16-1975, 45 y.o.   MRN: [id number]  HPI  Pt is a 45 yo male with left ear pain with hearing loss for over 2 months. No fever, chills, nausea, vomiting. He is taking daily anti-histamine. No sinus pressure, ST, cough, SOB.   Marland Kitchen. Active Ambulatory Problems    Diagnosis Date Noted  . Annual physical exam 01/27/2016  . Corn 01/27/2016  . Hallux valgus, left 01/27/2016  . Hypertriglyceridemia 01/28/2016  . Lumbar degenerative disc disease 06/20/2016  . Overweight (BMI 25.0-29.9) 09/22/2016  . Skin lesion of hand 09/22/2016  . Erectile dysfunction 09/22/2016  . Mass of wrist, left 10/12/2017  . Controlled type 2 diabetes mellitus without complication, without long-term current use of insulin (HCC) 08/29/2018  . Benign essential hypertension 08/29/2018  . Right cervical radiculopathy 01/06/2019  . PTSD (post-traumatic stress disorder) 10/27/2019  . Left ear impacted cerumen 05/10/2020  . ETD (Eustachian tube dysfunction), bilateral 05/10/2020   Resolved Ambulatory Problems    Diagnosis Date Noted  . Sebaceous cyst 01/27/2016   Past Medical History:  Diagnosis Date  . Arthritis   . Diabetes mellitus without complication (HCC)   . ED (erectile dysfunction)   . Hypertension        Review of Systems See HPI.     Objective:   Physical Exam Vitals reviewed.  Constitutional:      Appearance: Normal appearance.  HENT:     Right Ear: Ear canal and external ear normal. There is no impacted cerumen.     Left Ear: External ear normal. There is impacted cerumen.     Ears:     Comments: Left TM cerumen impaction. After irrigation left canal erythematous.  TM with some fluid behind.  No pain with palpation to tragus or lifting up on auricle, bilaterally.     Nose: No congestion.     Mouth/Throat:     Mouth: Mucous membranes are moist.  Eyes:     Extraocular Movements: Extraocular movements intact.      Conjunctiva/sclera: Conjunctivae normal.     Pupils: Pupils are equal, round, and reactive to light.  Cardiovascular:     Rate and Rhythm: Normal rate.  Lymphadenopathy:     Cervical: No cervical adenopathy.  Neurological:     General: No focal deficit present.     Mental Status: He is alert and oriented to person, place, and time.  Psychiatric:        Mood and Affect: Mood normal.           Assessment & Plan:  Marland KitchenMarland KitchenRandie was seen today for ear problem.  Diagnoses and all orders for this visit:  Left ear impacted cerumen  ETD (Eustachian tube dysfunction), bilateral -     methylPREDNISolone (MEDROL DOSEPAK) 4 MG TBPK tablet; Take as directed by package insert.   Marland Kitchen.Cerumen Removal Template: Indication: Cerumen impaction of the ear(s) Medical necessity statement: On physical examination, cerumen impairs clinically significant portions of the external auditory canal, and tympanic membrane. Noted obstructive, copious cerumen that cannot be removed without magnification and instrumentations requiring physician skills Consent: Discussed benefits and risks of procedure and verbal consent obtained Procedure: Patient was prepped for the procedure. Utilized an otoscope to assess and take note of the ear canal, the tympanic membrane, and the presence, amount, and placement of the cerumen. Gentle water irrigation and soft plastic curette was utilized to remove cerumen.  Post procedure examination shows cerumen was completely  removed. Patient tolerated procedure well. The patient is made aware that they may experience temporary vertigo, temporary hearing loss, and temporary discomfort. If these symptom last for more than 24 hours to call the clinic or proceed to the ED.  Both TM's visualized. No infection. Some external inflammation of left external canal. Appearance of some fluid behind both TM's. On daily anti-histamine. Consider flonase. Medrol dose pack given.

## 2020-09-30 ENCOUNTER — Telehealth (INDEPENDENT_AMBULATORY_CARE_PROVIDER_SITE_OTHER): Payer: BC Managed Care – PPO | Admitting: Family Medicine

## 2020-09-30 ENCOUNTER — Encounter: Payer: Self-pay | Admitting: Family Medicine

## 2020-09-30 DIAGNOSIS — U071 COVID-19: Secondary | ICD-10-CM | POA: Diagnosis not present

## 2020-09-30 MED ORDER — PHENOL 1.4 % MT LIQD
1.0000 | OROMUCOSAL | 1 refills | Status: DC | PRN
Start: 2020-09-30 — End: 2021-05-06

## 2020-09-30 NOTE — Patient Instructions (Signed)

## 2020-09-30 NOTE — Progress Notes (Signed)
Virtual Video Visit via MyChart Note  I connected with  Carl Griffith on 09/30/20 at  8:10 AM EDT by the video enabled telemedicine application for MyChart, and verified that I am speaking with the correct person using two identifiers.   I introduced myself as a Publishing rights manager with the practice. We discussed the limitations of evaluation and management by telemedicine and the availability of in person appointments. The patient expressed understanding and agreed to proceed.  Participating parties in this visit include: The patient and the nurse practitioner listed.  The patient is: At home I am: In the office - [facility]  Subjective:    CC:  Chief Complaint  Patient presents with   Covid Positive    HPI: Carl Griffith is a 45 y.o. year old male presenting today via MyChart today for positive COVID test.  Patient woke up with a terrible headache on Tuesday that developed into flu-like symptoms by the end of the day. He had a positive home COVID test that night. States he has been having rhinorrhea, low grade fevers, fatigue, body aches, generalized weakness, sore throat, occasional cough with minimal sputum production, and one day of loose stools. States he feels slightly better than he did yesterday, but still "feels like I've been beat down." He has been getting some improvement with tylenol and ibuprofen. He would like to try some spray for sore throat. States cough is manageable at this time and he does not feel like he needs antiviral therapy.   He denies chest pain, shortness of breath, nausea, vomiting, GI/GU symptoms, loss of taste/smell.   Past medical history, Surgical history, Family history not pertinant except as noted below, Social history, Allergies, and medications have been entered into the medical record, reviewed, and corrections made.   Review of Systems:  All review of systems negative except what is listed in the HPI   Objective:     General:  Speaking clearly in complete sentences. Absent shortness of breath noted.   Alert and oriented x3.   Normal judgment.  Absent acute distress.   Impression and Recommendations:    1. COVID-19 Patient on day 3 of symptoms with comorbidities. Educated on antiviral options, but patient declines at this time. He is requesting list of OTC meds he is allowed to take - attaching info to AVS. Sending in chloraseptic spray for sore throat. Declines needing anything additional for cough or other troublesome symptoms. Encouraged rest, hydration, humidifier use, warm liquids with honey and lemon, vitamins, OTC cough/cold/analgesics. Quarantine guidelines discussed. Patient aware of signs/symptoms requiring further/urgent evaluation.   - phenol (CHLORASEPTIC) 1.4 % LIQD; Use as directed 1 spray in the mouth or throat as needed for throat irritation / pain.  Dispense: 177 mL; Refill: 1    Follow-up if symptoms worsen or fail to improve.    I discussed the assessment and treatment plan with the patient. The patient was provided an opportunity to ask questions and all were answered. The patient agreed with the plan and demonstrated an understanding of the instructions.   The patient was advised to call back or seek an in-person evaluation if the symptoms worsen or if the condition fails to improve as anticipated.  I spent 20 minutes dedicated to the care of this patient on the date of this encounter to include pre-visit chart review of prior notes and results, face-to-face time with the patient, and post-visit ordering of testing as indicated.   Lollie Marrow Reola Calkins, DNP, FNP-C

## 2020-10-01 DIAGNOSIS — U071 COVID-19: Secondary | ICD-10-CM

## 2020-10-01 MED ORDER — NIRMATRELVIR/RITONAVIR (PAXLOVID)TABLET: 3.0000 | ORAL_TABLET | Freq: Two times a day (BID) | ORAL | 0 refills | Status: DC

## 2021-01-05 ENCOUNTER — Other Ambulatory Visit: Payer: Self-pay | Admitting: Sports Medicine

## 2021-01-06 NOTE — Telephone Encounter (Signed)
Patient has scheduled a physical for 01/12/21 but stated he might have to cancel if his insurance policy cancels before then, that he just got laid off.

## 2021-01-12 ENCOUNTER — Encounter: Payer: BC Managed Care – PPO | Admitting: Sports Medicine

## 2021-05-06 ENCOUNTER — Ambulatory Visit: Payer: BC Managed Care – PPO | Admitting: Sports Medicine

## 2021-05-06 VITALS — BP 149/88 | HR 89 | Ht 71.0 in | Wt 206.0 lb

## 2021-05-06 DIAGNOSIS — R030 Elevated blood-pressure reading, without diagnosis of hypertension: Secondary | ICD-10-CM

## 2021-05-06 DIAGNOSIS — E119 Type 2 diabetes mellitus without complications: Secondary | ICD-10-CM

## 2021-05-06 DIAGNOSIS — M51369 Other intervertebral disc degeneration, lumbar region without mention of lumbar back pain or lower extremity pain: Secondary | ICD-10-CM

## 2021-05-06 DIAGNOSIS — M5136 Other intervertebral disc degeneration, lumbar region: Secondary | ICD-10-CM

## 2021-05-06 DIAGNOSIS — R7989 Other specified abnormal findings of blood chemistry: Secondary | ICD-10-CM

## 2021-05-06 DIAGNOSIS — L918 Other hypertrophic disorders of the skin: Secondary | ICD-10-CM

## 2021-05-06 LAB — POCT UA - MICROALBUMIN
Albumin/Creatinine Ratio, Urine, POC: <30
Creatinine, POC: 200 mg/dL
Microalbumin Ur, POC: 10 mg/L

## 2021-05-06 LAB — POCT GLYCOSYLATED HEMOGLOBIN (HGB A1C): Hemoglobin A1C: 6 % — AB (ref 4.0–5.6)

## 2021-05-06 MED ORDER — MAGNESIUM OXIDE 400 MG PO TABS: 800.0000 mg | ORAL_TABLET | Freq: Every day | ORAL | 3 refills | Status: DC

## 2021-05-06 MED ORDER — RYBELSUS 3 MG PO TABS: 1.0000 | ORAL_TABLET | Freq: Every day | ORAL | 0 refills | Status: DC

## 2021-05-06 MED ORDER — METFORMIN HCL ER 500 MG PO TB24: 500.0000 mg | ORAL_TABLET | Freq: Every day | ORAL | 0 refills | Status: DC

## 2021-05-06 NOTE — Assessment & Plan Note (Signed)
Got laid off, is now off of all medications, would like to restart, we did get his A1c down from 7.3% down to the upper fours with Ozempic, we will restart with a low-dose metformin and Rybelsus. ?Urine microalbumin today as well as other labs.   ?Sounds like he did have his eye exam last month at an office in [place]. ?

## 2021-05-06 NOTE — Assessment & Plan Note (Signed)
Bilateral calf cramping nocturnal, adding nighttime magnesium, if insufficient improvement we will further investigate the lumbar spine. ?

## 2021-05-06 NOTE — Assessment & Plan Note (Signed)
Left axilla, cryotherapy today, return as needed. ?

## 2021-05-06 NOTE — Addendum Note (Signed)
Addended by: Gaynelle Arabian on: 05/06/2021 09:13 AM ? ? Modules accepted: Orders ? ?

## 2021-05-06 NOTE — Assessment & Plan Note (Signed)
Blood pressure elevated today, mild headache, did not get much sleep. ?Off of all medications. ?He will check his blood pressures at home and let me know over the next couple weeks. ?

## 2021-05-06 NOTE — Progress Notes (Addendum)
? ? ?  Procedures performed today:   ? ?Procedure:  Cryodestruction of left axillary acrochordon ?Consent obtained and verified. ?Time-out conducted. ?Noted no overlying erythema, induration, or other signs of local infection. ?Completed without difficulty using Cryo-Gun. ?Advised to call if fevers/chills, erythema, induration, drainage, or persistent bleeding. ? ?Independent interpretation of notes and tests performed by another provider:  ? ?None. ? ?Brief History, Exam, Impression, and Recommendations:   ? ?Controlled type 2 diabetes mellitus without complication, without long-term current use of insulin (HCC) ?Got laid off, is now off of all medications, would like to restart, we did get his A1c down from 7.3% down to the upper fours with Ozempic, we will restart with a low-dose metformin and Rybelsus. ?Urine microalbumin today as well as other labs.   ?Sounds like he did have his eye exam last month at an office in [place]. ? ?Elevated blood pressure reading ?Blood pressure elevated today, mild headache, did not get much sleep. ?Off of all medications. ?He will check his blood pressures at home and let me know over the next couple weeks. ? ?Acrochordon ?Left axilla, cryotherapy today, return as needed. ? ?Lumbar degenerative disc disease ?Bilateral calf cramping nocturnal, adding nighttime magnesium, if insufficient improvement we will further investigate the lumbar spine. ? ?Elevated TSH ?Adding t3 and t4 levels. ? ? ? ?___________________________________________ ?[name]. [name], M.D., ABFM., CAQSM. ?Primary Care and Sports Medicine ?[facility] ? ?Adjunct Instructor of Family Medicine  ?[employer] ?

## 2021-05-06 NOTE — Addendum Note (Signed)
Addended by: Monica Becton on: 05/06/2021 09:43 AM ? ? Modules accepted: Orders ? ?

## 2021-05-07 DIAGNOSIS — E038 Other specified hypothyroidism: Secondary | ICD-10-CM | POA: Insufficient documentation

## 2021-05-07 DIAGNOSIS — R7989 Other specified abnormal findings of blood chemistry: Secondary | ICD-10-CM | POA: Insufficient documentation

## 2021-05-07 LAB — COMPREHENSIVE METABOLIC PANEL
AG Ratio: 1.7 (calc) (ref 1.0–2.5)
ALT: 46 U/L (ref 9–46)
AST: 27 U/L (ref 10–40)
Albumin: 4.5 g/dL (ref 3.6–5.1)
Alkaline phosphatase (APISO): 117 U/L (ref 36–130)
BUN: 14 mg/dL (ref 7–25)
CO2: 25 mmol/L (ref 20–32)
Calcium: 9.5 mg/dL (ref 8.6–10.3)
Chloride: 103 mmol/L (ref 98–110)
Creat: 1 mg/dL (ref 0.60–1.29)
Globulin: 2.7 g/dL (calc) (ref 1.9–3.7)
Glucose, Bld: 105 mg/dL — ABNORMAL HIGH (ref 65–99)
Potassium: 4.6 mmol/L (ref 3.5–5.3)
Sodium: 139 mmol/L (ref 135–146)
Total Bilirubin: 0.3 mg/dL (ref 0.2–1.2)
Total Protein: 7.2 g/dL (ref 6.1–8.1)

## 2021-05-07 LAB — CBC
HCT: 39.9 % (ref 38.5–50.0)
Hemoglobin: 13.3 g/dL (ref 13.2–17.1)
MCH: 29.6 pg (ref 27.0–33.0)
MCHC: 33.3 g/dL (ref 32.0–36.0)
MCV: 88.9 fL (ref 80.0–100.0)
MPV: 12.8 fL — ABNORMAL HIGH (ref 7.5–12.5)
Platelets: 166 10*3/uL (ref 140–400)
RBC: 4.49 10*6/uL (ref 4.20–5.80)
RDW: 13 % (ref 11.0–15.0)
WBC: 7.4 10*3/uL (ref 3.8–10.8)

## 2021-05-07 LAB — TSH: TSH: 8.74 mIU/L — ABNORMAL HIGH (ref 0.40–4.50)

## 2021-05-07 LAB — LIPID PANEL
Cholesterol: 175 mg/dL (ref <200)
HDL: 28 mg/dL — ABNORMAL LOW (ref >=40)
LDL Cholesterol (Calc): 110 mg/dL (calc) — ABNORMAL HIGH
Non-HDL Cholesterol (Calc): 147 mg/dL (calc) — ABNORMAL HIGH (ref <130)
Total CHOL/HDL Ratio: 6.3 (calc) — ABNORMAL HIGH (ref <5.0)
Triglycerides: 245 mg/dL — ABNORMAL HIGH (ref <150)

## 2021-05-07 NOTE — Assessment & Plan Note (Signed)
Adding t3 and t4 levels. ?

## 2021-05-07 NOTE — Addendum Note (Signed)
Addended by: Silverio Decamp on: 05/07/2021 08:55 AM ? ? Modules accepted: Orders ? ?

## 2021-05-24 ENCOUNTER — Encounter: Payer: Self-pay | Admitting: Sports Medicine

## 2021-05-24 DIAGNOSIS — E119 Type 2 diabetes mellitus without complications: Secondary | ICD-10-CM

## 2021-05-25 MED ORDER — OZEMPIC (1 MG/DOSE) 4 MG/3ML ~~LOC~~ SOPN: 1.0000 mg | PEN_INJECTOR | SUBCUTANEOUS | 4 refills | Status: DC

## 2021-05-29 ENCOUNTER — Other Ambulatory Visit: Payer: Self-pay | Admitting: Sports Medicine

## 2021-05-29 DIAGNOSIS — E119 Type 2 diabetes mellitus without complications: Secondary | ICD-10-CM

## 2021-08-01 ENCOUNTER — Telehealth: Payer: Self-pay | Admitting: Sports Medicine

## 2021-08-01 DIAGNOSIS — K047 Periapical abscess without sinus: Secondary | ICD-10-CM | POA: Insufficient documentation

## 2021-08-01 MED ORDER — AMOXICILLIN-POT CLAVULANATE 875-125 MG PO TABS: 1.0000 | ORAL_TABLET | Freq: Two times a day (BID) | ORAL | 0 refills | Status: DC

## 2021-08-01 NOTE — Telephone Encounter (Signed)
 Single prescription of Augmentin  sent, follow-up for this needs to be with his dentist, not with me, other follow-up can be with me.

## 2021-08-05 ENCOUNTER — Ambulatory Visit: Payer: BC Managed Care – PPO | Admitting: Sports Medicine

## 2021-09-28 ENCOUNTER — Ambulatory Visit: Payer: BC Managed Care – PPO | Admitting: Sports Medicine

## 2021-09-28 DIAGNOSIS — M7742 Metatarsalgia, left foot: Secondary | ICD-10-CM

## 2021-09-28 NOTE — Assessment & Plan Note (Signed)
Pleasant 46 year old male, months of pain left plantar foot at the metatarsal heads, second through fourth. No abnormal callus yet. We discussed the anatomy and pathophysiology, we placed a metatarsal pad, I gave him another 1 for his work shoe and he will order more metatarsal pads for the rest of his shoes. Avoid barefoot walking, return to see me in 6 weeks.

## 2021-09-28 NOTE — Progress Notes (Signed)
    Procedures performed today:    None.  Independent interpretation of notes and tests performed by another provider:   None.  Brief History, Exam, Impression, and Recommendations:    Metatarsalgia, left foot Pleasant 46 year old male, months of pain left plantar foot at the metatarsal heads, second through fourth. No abnormal callus yet. We discussed the anatomy and pathophysiology, we placed a metatarsal pad, I gave him another 1 for his work shoe and he will order more metatarsal pads for the rest of his shoes. Avoid barefoot walking, return to see me in 6 weeks.    ____________________________________________ [name]. [name], M.D., ABFM., CAQSM., AME. Primary Care and Sports Medicine [facility]  Adjunct Professor of Family Medicine  [employer] [employer] [facility] [employer] [facility] [employer]  Restaurant manager, fast food

## 2021-11-09 ENCOUNTER — Ambulatory Visit: Payer: BC Managed Care – PPO | Admitting: Sports Medicine

## 2022-06-08 IMAGING — XA DG INJECT/[PERSON_NAME] INC NEEDLE/CATH/PLC EPI/CERV/THOR W/IMG
2 series · 2 of 2 positions shown · non-contrast
Comparison: none

CLINICAL DATA: Cervical spondylosis without myelopathy. Excellent
response to an epidural injection in [DATE]. Neck pain has recently
recurred and is radiating to the left shoulder.

[Series 1: ortho standard · 1 of 1 slices shown (1 of 2)]
[im 1/1]
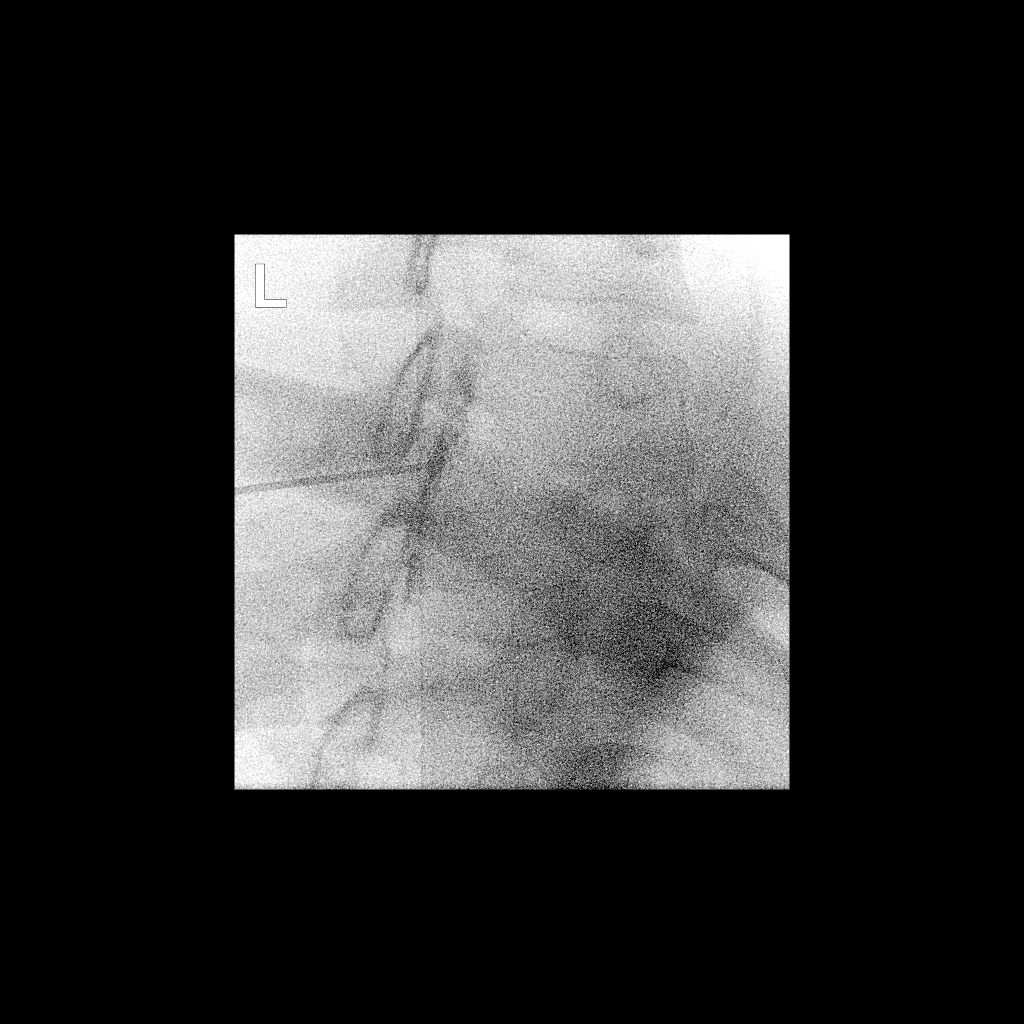

[Series 2: ortho standard · 1 of 1 slices shown (2 of 2)]
[im 1/1]
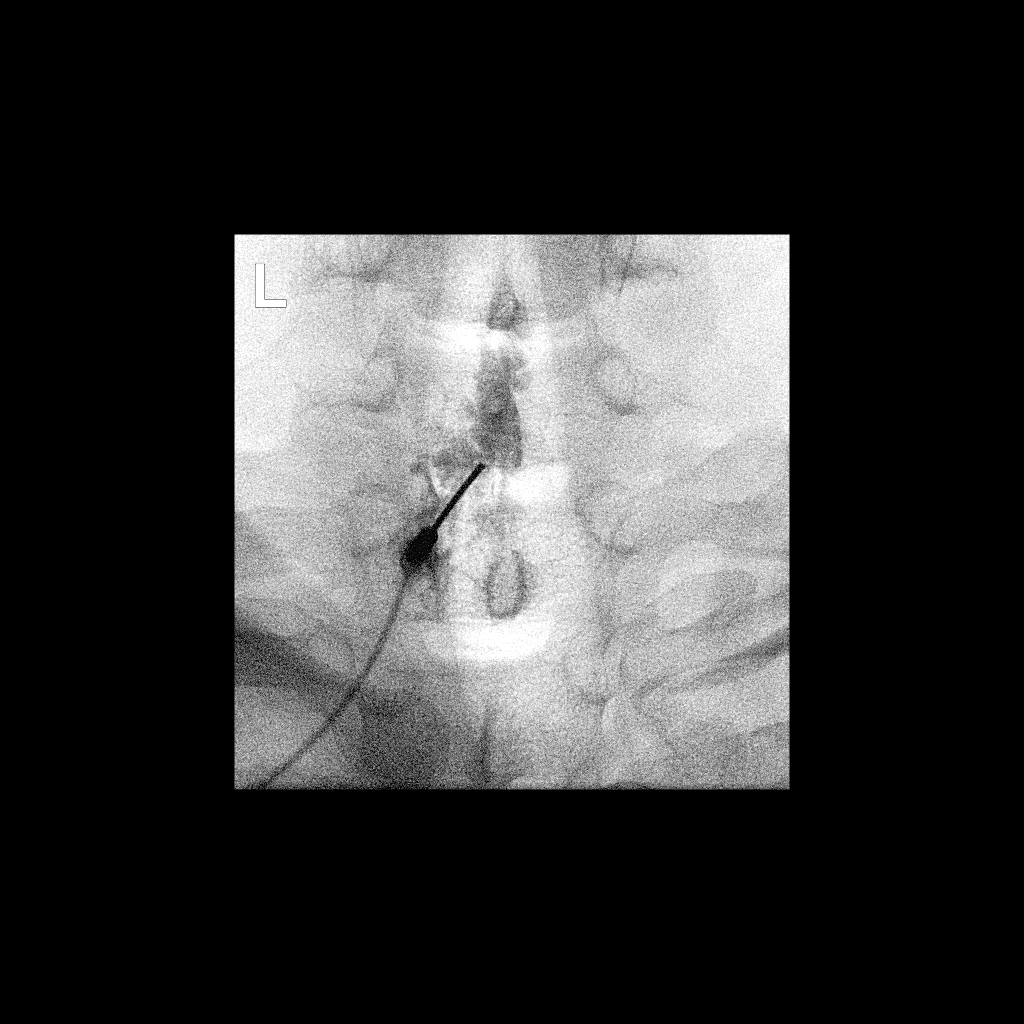

[2 of 2 positions shown; findings below may reference images not displayed]

FLUOROSCOPY TIME:  Fluoroscopy Time: 19 seconds

Radiation Exposure Index: 7.32 microGray*m^2

PROCEDURE:
The procedure, risks, benefits, and alternatives were explained to
the patient. Questions regarding the procedure were encouraged and
answered. The patient understands and consents to the procedure.

CERVICAL EPIDURAL INJECTION

An interlaminar approach was performed on the left at C7-T1. A
inch 20 gauge epidural needle was advanced using loss-of-resistance
technique.

DIAGNOSTIC EPIDURAL INJECTION

Injection of Isovue-M 300 shows a good epidural pattern with spread
above and below the level of needle placement, primarily on the
left. No vascular opacification is seen.

THERAPEUTIC EPIDURAL INJECTION

1.5 ml of Kenalog 40 mixed with 2 ml of normal saline were then
instilled. The procedure was well-tolerated, and the patient was
discharged thirty minutes following the injection in good condition.
IMPRESSION: Technically successful interlaminar epidural injection on the left
at C7-T1.

## 2022-06-14 ENCOUNTER — Encounter: Payer: Self-pay | Admitting: Sports Medicine

## 2022-06-14 ENCOUNTER — Ambulatory Visit: Payer: BC Managed Care – PPO | Admitting: Sports Medicine

## 2022-06-14 VITALS — BP 141/95 | HR 79 | Wt 204.0 lb

## 2022-06-14 DIAGNOSIS — K5790 Diverticulosis of intestine, part unspecified, without perforation or abscess without bleeding: Secondary | ICD-10-CM

## 2022-06-14 DIAGNOSIS — E119 Type 2 diabetes mellitus without complications: Secondary | ICD-10-CM

## 2022-06-14 DIAGNOSIS — Z7984 Long term (current) use of oral hypoglycemic drugs: Secondary | ICD-10-CM | POA: Diagnosis not present

## 2022-06-14 DIAGNOSIS — I10 Essential (primary) hypertension: Secondary | ICD-10-CM

## 2022-06-14 LAB — COMPREHENSIVE METABOLIC PANEL: eGFR: 72

## 2022-06-14 MED ORDER — RYBELSUS 7 MG PO TABS
7.0000 mg | ORAL_TABLET | Freq: Every day | ORAL | 3 refills | Status: DC
Start: 2022-06-14 — End: 2022-10-28

## 2022-06-14 NOTE — Progress Notes (Signed)
    Procedures performed today:    None.  Independent interpretation of notes and tests performed by another provider:   None.  Brief History, Exam, Impression, and Recommendations:    Benign essential hypertension Elevated but typically normalizes on recheck. 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 I would rather get his weight and diabetes under better control first as his blood pressure will follow, as opposed to starting a blood pressure medicine.  Controlled type 2 diabetes mellitus without complication, without long-term current use of insulin (HCC) Checking labs, he has been off of Ozempic, we will restart Rybelsus, 3 mg samples given and a prescription sent for 7 mg. We can recheck A1c in 3 months. For insurance coverage purposes patient tried and did not tolerate metformin due to GI side effects. Adding diabetic eye exam.  Diverticulosis History of diverticulosis, asymptomatic currently, we we will discuss dietary changes, he is also in the age range for cancer screening so I think he is best served with a colonoscopy.    ____________________________________________ [name]. [name], M.D., ABFM., CAQSM., AME. Primary Care and Sports Medicine [facility]  Adjunct Professor of Family Medicine  [place] [employer] [facility] [employer]  Restaurant manager, fast food

## 2022-06-14 NOTE — Assessment & Plan Note (Addendum)
Checking labs, he has been off of Ozempic, we will restart Rybelsus, 3 mg samples given and a prescription sent for 7 mg. We can recheck A1c in 3 months. For insurance coverage purposes patient tried and did not tolerate metformin due to GI side effects. Adding diabetic eye exam.

## 2022-06-14 NOTE — Assessment & Plan Note (Signed)
Elevated but typically normalizes on recheck. 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 I would rather get his weight and diabetes under better control first as his blood pressure will follow, as opposed to starting a blood pressure medicine.

## 2022-06-14 NOTE — Assessment & Plan Note (Signed)
History of diverticulosis, asymptomatic currently, we we will discuss dietary changes, he is also in the age range for cancer screening so I think he is best served with a colonoscopy.

## 2022-06-15 LAB — MICROALBUMIN / CREATININE URINE RATIO
Creatinine, Urine: 39 mg/dL (ref 20–320)
Microalb Creat Ratio: 8 mg/g creat (ref <30)
Microalb, Ur: 0.3 mg/dL

## 2022-06-15 LAB — LIPID PANEL
Cholesterol: 187 mg/dL (ref <200)
HDL: 30 mg/dL — ABNORMAL LOW (ref >=40)
Non-HDL Cholesterol (Calc): 157 mg/dL (calc) — ABNORMAL HIGH (ref <130)
Total CHOL/HDL Ratio: 6.2 (calc) — ABNORMAL HIGH (ref <5.0)
Triglycerides: 455 mg/dL — ABNORMAL HIGH (ref <150)

## 2022-06-15 LAB — COMPREHENSIVE METABOLIC PANEL
AG Ratio: 1.4 (calc) (ref 1.0–2.5)
ALT: 52 U/L — ABNORMAL HIGH (ref 9–46)
AST: 34 U/L (ref 10–40)
Albumin: 4.5 g/dL (ref 3.6–5.1)
Alkaline phosphatase (APISO): 109 U/L (ref 36–130)
BUN: 13 mg/dL (ref 7–25)
CO2: 26 mmol/L (ref 20–32)
Calcium: 9.8 mg/dL (ref 8.6–10.3)
Chloride: 103 mmol/L (ref 98–110)
Creat: 0.97 mg/dL (ref 0.60–1.29)
Globulin: 3.2 g/dL (calc) (ref 1.9–3.7)
Glucose, Bld: 133 mg/dL — ABNORMAL HIGH (ref 65–99)
Potassium: 4.4 mmol/L (ref 3.5–5.3)
Sodium: 139 mmol/L (ref 135–146)
Total Bilirubin: 0.7 mg/dL (ref 0.2–1.2)
Total Protein: 7.7 g/dL (ref 6.1–8.1)

## 2022-06-15 LAB — CBC
HCT: 44.7 % (ref 38.5–50.0)
Hemoglobin: 14.9 g/dL (ref 13.2–17.1)
MCH: 28.8 pg (ref 27.0–33.0)
MCHC: 33.3 g/dL (ref 32.0–36.0)
MCV: 86.5 fL (ref 80.0–100.0)
MPV: 12.6 fL — ABNORMAL HIGH (ref 7.5–12.5)
Platelets: 139 10*3/uL — ABNORMAL LOW (ref 140–400)
RBC: 5.17 10*6/uL (ref 4.20–5.80)
RDW: 13.6 % (ref 11.0–15.0)
WBC: 6.3 10*3/uL (ref 3.8–10.8)

## 2022-06-15 LAB — HEMOGLOBIN A1C
Hgb A1c MFr Bld: 7 % of total Hgb — ABNORMAL HIGH (ref <5.7)
Mean Plasma Glucose: 154 mg/dL
eAG (mmol/L): 8.5 mmol/L

## 2022-06-15 LAB — TSH: TSH: 2.72 mIU/L (ref 0.40–4.50)

## 2022-07-15 ENCOUNTER — Encounter: Payer: Self-pay | Admitting: Sports Medicine

## 2022-07-15 DIAGNOSIS — E781 Pure hyperglyceridemia: Secondary | ICD-10-CM

## 2022-07-17 MED ORDER — FENOFIBRATE 160 MG PO TABS: 160.0000 mg | ORAL_TABLET | Freq: Every day | ORAL | 3 refills | Status: DC

## 2022-09-14 ENCOUNTER — Encounter: Payer: Self-pay | Admitting: Sports Medicine

## 2022-09-14 ENCOUNTER — Ambulatory Visit: Payer: BC Managed Care – PPO | Admitting: Sports Medicine

## 2022-09-14 VITALS — BP 134/90 | HR 87 | Ht 71.0 in | Wt 196.0 lb

## 2022-09-14 DIAGNOSIS — E119 Type 2 diabetes mellitus without complications: Secondary | ICD-10-CM | POA: Diagnosis not present

## 2022-09-14 DIAGNOSIS — Z7985 Long-term (current) use of injectable non-insulin antidiabetic drugs: Secondary | ICD-10-CM | POA: Diagnosis not present

## 2022-09-14 LAB — POCT GLYCOSYLATED HEMOGLOBIN (HGB A1C): Hemoglobin A1C: 5.4 % (ref 4.0–5.6)

## 2022-09-14 NOTE — Assessment & Plan Note (Signed)
A1c is under excellent control, 5.4%, due for diabetic eye exam, he did have 1 in August or September of last year and he will let his eye doctor know to forward me results. No other changes needed, return to see me in 6 months for repeat A1c.

## 2022-09-14 NOTE — Progress Notes (Signed)
    Procedures performed today:    None.  Independent interpretation of notes and tests performed by another provider:   None.  Brief History, Exam, Impression, and Recommendations:    Controlled type 2 diabetes mellitus without complication, without long-term current use of insulin (HCC) A1c is under excellent control, 5.4%, due for diabetic eye exam, he did have 1 in August or September of last year and he will let his eye doctor know to forward me results. No other changes needed, return to see me in 6 months for repeat A1c.   ____________________________________________ [name]. [name], M.D., ABFM., CAQSM., AME. Primary Care and Sports Medicine [facility]  Adjunct Professor of Family Medicine  [place] [employer] [facility] [employer]  Restaurant manager, fast food

## 2022-10-02 ENCOUNTER — Encounter (INDEPENDENT_AMBULATORY_CARE_PROVIDER_SITE_OTHER): Payer: BC Managed Care – PPO | Admitting: Sports Medicine

## 2022-10-02 DIAGNOSIS — K5790 Diverticulosis of intestine, part unspecified, without perforation or abscess without bleeding: Secondary | ICD-10-CM

## 2022-10-03 MED ORDER — CIPROFLOXACIN HCL 750 MG PO TABS
750.0000 mg | ORAL_TABLET | Freq: Two times a day (BID) | ORAL | 0 refills | Status: AC
Start: 2022-10-03 — End: 2022-10-10

## 2022-10-03 MED ORDER — METRONIDAZOLE 500 MG PO TABS
500.0000 mg | ORAL_TABLET | Freq: Two times a day (BID) | ORAL | 0 refills | Status: AC
Start: 2022-10-03 — End: 2022-10-10

## 2022-10-03 NOTE — Telephone Encounter (Signed)
I spent 5 total minutes of online digital evaluation and management services in this patient-initiated request for online care. 

## 2022-10-04 ENCOUNTER — Encounter: Payer: Self-pay | Admitting: Family Medicine

## 2022-10-04 ENCOUNTER — Ambulatory Visit: Payer: BC Managed Care – PPO | Admitting: Family Medicine

## 2022-10-04 VITALS — BP 123/80 | HR 84 | Temp 98.0°F | Ht 71.0 in | Wt 197.0 lb

## 2022-10-04 DIAGNOSIS — K5792 Diverticulitis of intestine, part unspecified, without perforation or abscess without bleeding: Secondary | ICD-10-CM | POA: Diagnosis not present

## 2022-10-04 NOTE — Assessment & Plan Note (Addendum)
Cipro and Flagyl started yesterday per PCP. No fever. Abdomen soft with bowel sounds present x 4 quadrants. Having normal BM's. Will continue with prescribed antibiotics. Symptoms reviewed that warrant seeking higher level of care. Vital signs are stable. Not ill appearing. Tenderness in LUQ and LLQ upon palpation.

## 2022-10-04 NOTE — Progress Notes (Signed)
Established Patient Office Visit  Subjective   Patient ID: Carl Griffith, male    DOB: March 08, 1975  Age: 47 y.o. MRN: [id number]  Chief Complaint  Patient presents with   Abdominal Pain    4 yr ago pt has dx diverticulitis and and pt states he has a flare up, groin pain and left flank pain,  discomfort while during the bathroom     HPI  Presents today for an acute visit with complaint of pain in low abdomen that is radiating to LUQ. Feels similar to divertilitis  flare. Feels constipation. Normal BM once he has one.  Symptoms have been present  2 days  Associated symptoms include: nausea at times Pertinent negatives: no vomiting, no fever, no chills.  Pain severity: 0/10 currently, 5/10 at times.  Treatments tried include : Cipro and Flagyl started last night per PCP (has taken dose # 2)  Treatment effective :   Chart review:  Diagnosed with divertilulits in 2019 pre CT scan in Care Every [facility]. Reports he was very ill at that time.  Has not had flare in a while.   Review of Systems  Constitutional:  Negative for chills and fever.  Gastrointestinal:  Positive for abdominal pain and nausea ("every once in a while"). Negative for blood in stool, constipation, diarrhea and vomiting.  Genitourinary:  Negative for dysuria.      Objective:     BP 123/80   Pulse 84   Temp 98 F (36.7 C) (Oral)   Ht 5\' 11"  (1.803 m)   Wt 197 lb (89.4 kg)   SpO2 99%   BMI 27.48 kg/m  BP Readings from Last 3 Encounters:  10/04/22 123/80  09/14/22 (!) 134/90  06/14/22 (!) 141/95      Physical Exam Vitals and nursing note reviewed.  Constitutional:      General: He is not in acute distress.    Appearance: He is well-developed. He is not ill-appearing or toxic-appearing.  Cardiovascular:     Rate and Rhythm: Regular rhythm.     Heart sounds: Normal heart sounds.  Pulmonary:     Effort: Pulmonary effort is normal.     Breath sounds: Normal breath sounds.  Abdominal:     General:  Bowel sounds are normal.     Palpations: Abdomen is soft.     Tenderness: There is abdominal tenderness in the left upper quadrant and left lower quadrant. There is no guarding.  Skin:    General: Skin is warm and dry.  Neurological:     General: No focal deficit present.     Mental Status: He is alert.  Psychiatric:        Mood and Affect: Mood normal.        Behavior: Behavior normal.        Thought Content: Thought content normal.        Judgment: Judgment normal.     No results found for any visits on 10/04/22.    The 10-year ASCVD risk score (Arnett DK, et al., 2019) is: 18.3%    Assessment & Plan:   Problem List Items Addressed This Visit     Diverticulitis - Primary    Cipro and Flagyl started yesterday per PCP. No fever. Abdomen soft with bowel sounds present x 4 quadrants. Having normal BM's. Will continue with prescribed antibiotics. Symptoms reviewed that warrant seeking higher level of care. Vital signs are stable. Not ill appearing. Tenderness in LUQ and LLQ upon palpation.  Agrees with plan of care discussed.  Questions answered.   Return for follow-up with PCP within one week to ensure resolution in symptoms .    Novella Olive, FNP

## 2022-10-11 ENCOUNTER — Encounter: Payer: Self-pay | Admitting: Sports Medicine

## 2022-10-11 ENCOUNTER — Ambulatory Visit: Payer: BC Managed Care – PPO | Admitting: Sports Medicine

## 2022-10-11 VITALS — BP 133/76 | HR 89 | Ht 71.0 in | Wt 195.0 lb

## 2022-10-11 DIAGNOSIS — K5790 Diverticulosis of intestine, part unspecified, without perforation or abscess without bleeding: Secondary | ICD-10-CM

## 2022-10-11 NOTE — Progress Notes (Signed)
    Procedures performed today:    None.  Independent interpretation of notes and tests performed by another provider:   None.  Brief History, Exam, Impression, and Recommendations:    Diverticulosis History of severe colonic/sigmoid diverticulosis with a flare of diverticulitis recently. Last flare was 2019, CT scan did show significant thickening of the colonic wall, referral to gastroenterology and evaluation for malignancy was recommended, he did see the gastroenterologist but he never followed through with his colonoscopy. More recently he had an increasing flare, we added Cipro and Flagyl and symptoms have improved, he does have a follow-up appointment with gastroenterology and colonoscopy will be planned at that point. Due to severity of symptoms, as well as lack of structural evaluation since his last flare in 2019 I would like a nonurgent CT with oral and IV contrast for planning purposes. I have also suggested dietary changes.    ____________________________________________ [name]. [name], M.D., ABFM., CAQSM., AME. Primary Care and Sports Medicine [facility]  Adjunct Professor of Family Medicine  [place] [employer] [facility] [employer] [facility] [employer]  Restaurant manager, fast food

## 2022-10-11 NOTE — Assessment & Plan Note (Addendum)
History of severe colonic/sigmoid diverticulosis with a flare of diverticulitis recently. Last flare was 2019, CT scan did show significant thickening of the colonic wall, referral to gastroenterology and evaluation for malignancy was recommended, he did see the gastroenterologist but he never followed through with his colonoscopy. More recently he had an increasing flare, we added Cipro and Flagyl and symptoms have improved, he does have a follow-up appointment with gastroenterology and colonoscopy will be planned at that point. Due to severity of symptoms, as well as lack of structural evaluation since his last flare in 2019 I would like a nonurgent CT with oral and IV contrast for planning purposes. I have also suggested dietary changes.

## 2022-10-20 ENCOUNTER — Other Ambulatory Visit: Payer: BC Managed Care – PPO

## 2022-10-28 ENCOUNTER — Other Ambulatory Visit: Payer: Self-pay | Admitting: Sports Medicine

## 2022-10-28 ENCOUNTER — Encounter: Payer: Self-pay | Admitting: Sports Medicine

## 2022-10-28 DIAGNOSIS — E119 Type 2 diabetes mellitus without complications: Secondary | ICD-10-CM

## 2022-12-11 LAB — HM COLONOSCOPY

## 2023-01-18 ENCOUNTER — Encounter (INDEPENDENT_AMBULATORY_CARE_PROVIDER_SITE_OTHER): Payer: BC Managed Care – PPO | Admitting: Sports Medicine

## 2023-01-18 DIAGNOSIS — K5790 Diverticulosis of intestine, part unspecified, without perforation or abscess without bleeding: Secondary | ICD-10-CM | POA: Diagnosis not present

## 2023-01-18 MED ORDER — METRONIDAZOLE 500 MG PO TABS: 500.0000 mg | ORAL_TABLET | Freq: Two times a day (BID) | ORAL | 0 refills | Status: AC

## 2023-01-18 MED ORDER — CIPROFLOXACIN HCL 750 MG PO TABS: 750.0000 mg | ORAL_TABLET | Freq: Two times a day (BID) | ORAL | 0 refills | Status: AC

## 2023-01-18 NOTE — Telephone Encounter (Signed)

## 2023-01-19 ENCOUNTER — Telehealth: Payer: Self-pay

## 2023-01-19 NOTE — Telephone Encounter (Signed)
Handled through [employer].

## 2023-01-19 NOTE — Telephone Encounter (Signed)
Copied from CRM [id number]. Topic: Clinical - Prescription Issue >> Jan 18, 2023  4:44 PM Tiffany H wrote: Reason for CRM: Patient's wife Victorino Dike called to advise that patient is having another diverticulitis flare up. Patient's wife advised that patient is usually prescribed Cipro and Flagyl for episodes. Patient sent a message via MyChart. They would like to start treatment today, if medication can be submitted. Please assist.   Medication not listed.

## 2023-03-20 ENCOUNTER — Ambulatory Visit: Payer: BC Managed Care – PPO | Admitting: Sports Medicine

## 2023-03-20 ENCOUNTER — Encounter: Payer: Self-pay | Admitting: Sports Medicine

## 2023-03-20 VITALS — BP 119/81 | HR 84 | Ht 71.0 in | Wt 193.0 lb

## 2023-03-20 DIAGNOSIS — Z7984 Long term (current) use of oral hypoglycemic drugs: Secondary | ICD-10-CM

## 2023-03-20 DIAGNOSIS — J0100 Acute maxillary sinusitis, unspecified: Secondary | ICD-10-CM

## 2023-03-20 DIAGNOSIS — E119 Type 2 diabetes mellitus without complications: Secondary | ICD-10-CM | POA: Diagnosis not present

## 2023-03-20 DIAGNOSIS — Z0000 Encounter for general adult medical examination without abnormal findings: Secondary | ICD-10-CM

## 2023-03-20 LAB — POCT GLYCOSYLATED HEMOGLOBIN (HGB A1C): Hemoglobin A1C: 5.6 % (ref 4.0–5.6)

## 2023-03-20 LAB — POCT INFLUENZA A/B
Influenza A, POC: NEGATIVE
Influenza B, POC: NEGATIVE

## 2023-03-20 LAB — POC COVID19 BINAXNOW: SARS Coronavirus 2 Ag: NEGATIVE

## 2023-03-20 MED ORDER — AZITHROMYCIN 250 MG PO TABS: ORAL_TABLET | ORAL | 0 refills | Status: DC

## 2023-03-20 MED ORDER — FLUTICASONE PROPIONATE 50 MCG/ACT NA SUSP: NASAL | 3 refills | Status: AC

## 2023-03-20 NOTE — Assessment & Plan Note (Signed)
A1c 5.6%, well-controlled. He is due for some other screenings including eye exam, foot exam, microalbumin. We will bring him back in May for fasting annual physical and get all the routine labs and catch him up on all of his screenings.

## 2023-03-20 NOTE — Progress Notes (Signed)
    Procedures performed today:    None.  Independent interpretation of notes and tests performed by another provider:   None.  Brief History, Exam, Impression, and Recommendations:    Annual physical exam A1c 5.6%, well-controlled. He is due for some other screenings including eye exam, foot exam, microalbumin. We will bring him back in May for fasting annual physical and get all the routine labs and catch him up on all of his screenings.  Controlled type 2 diabetes mellitus without complication, without long-term current use of insulin (HCC) A1c 5.6%, well-controlled. He is due for some other screenings including eye exam, foot exam, microalbumin. Order his eye exam. We will bring him back in May for fasting annual physical and get all the routine labs and catch him up on all of his screenings.  Acute maxillary sinusitis Carl Griffith does have diabetes, he also has had 3 to 4 days of headache, facial pain and pressure, watery eyes, coughing. No muscle aches, body aches, or fevers, no GI symptoms. Exam is normal. COVID and flu testing is negative. He is high risk with diabetes, adding azithromycin, Flonase. Return to see me as needed.  I spent 40 minutes of total time managing this patient today, this includes chart review, face to face, and non-face to face time.  ____________________________________________ [name]. [name], M.D., ABFM., CAQSM., AME. Primary Care and Sports Medicine [facility]  Adjunct Professor of Family Medicine  [employer]  Restaurant manager, fast food

## 2023-03-20 NOTE — Assessment & Plan Note (Addendum)
A1c 5.6%, well-controlled. He is due for some other screenings including eye exam, foot exam, microalbumin. Order his eye exam. We will bring him back in May for fasting annual physical and get all the routine labs and catch him up on all of his screenings.

## 2023-03-20 NOTE — Assessment & Plan Note (Addendum)
Benuel does have diabetes, he also has had 3 to 4 days of headache, facial pain and pressure, watery eyes, coughing. No muscle aches, body aches, or fevers, no GI symptoms. Exam is normal. COVID and flu testing is negative. He is high risk with diabetes, adding azithromycin, Flonase. Return to see me as needed.

## 2023-06-19 ENCOUNTER — Ambulatory Visit (INDEPENDENT_AMBULATORY_CARE_PROVIDER_SITE_OTHER): Payer: BC Managed Care – PPO | Admitting: Sports Medicine

## 2023-06-19 VITALS — BP 119/83 | HR 80 | Ht 71.0 in | Wt 193.0 lb

## 2023-06-19 DIAGNOSIS — R7989 Other specified abnormal findings of blood chemistry: Secondary | ICD-10-CM | POA: Diagnosis not present

## 2023-06-19 DIAGNOSIS — E119 Type 2 diabetes mellitus without complications: Secondary | ICD-10-CM | POA: Diagnosis not present

## 2023-06-19 DIAGNOSIS — Z7984 Long term (current) use of oral hypoglycemic drugs: Secondary | ICD-10-CM | POA: Diagnosis not present

## 2023-06-19 DIAGNOSIS — Z0000 Encounter for general adult medical examination without abnormal findings: Secondary | ICD-10-CM | POA: Diagnosis not present

## 2023-06-19 DIAGNOSIS — E038 Other specified hypothyroidism: Secondary | ICD-10-CM

## 2023-06-19 NOTE — Assessment & Plan Note (Signed)
 Fasting annual physical as above checking all routine labs, up-to-date on colon cancer screening, he does need his diabetic eye exam. His estranged father passed away yesterday, we discussed grief and avenues to seek help if need be.

## 2023-06-19 NOTE — Patient Instructions (Signed)
 [email or web address]  Go to "make an appointment."

## 2023-06-19 NOTE — Progress Notes (Addendum)
 Subjective:    CC: Annual Physical Exam  HPI:  This patient is here for their annual physical  I reviewed the past medical history, family history, social history, surgical history, and allergies today and no changes were needed.  Please see the problem list section below in epic for further details.  Past Medical History: Past Medical History:  Diagnosis Date   Arthritis    Diabetes mellitus without complication [facility])    ED (erectile dysfunction)    Hypertension    Hypertriglyceridemia 01/28/2016   PTSD (post-traumatic stress disorder)    Past Surgical History: Past Surgical History:  Procedure Laterality Date   ANTERIOR CERVICAL DECOMP/DISCECTOMY FUSION N/A 02/19/2020   Procedure: ANTERIOR CERVICAL DECOMPRESSION FUSION CERVICAL 6-CERVICAL 7 WITH INSTRUMENTATION AND ALLOGRAFT;  Surgeon: Virl Grimes, MD;  Location: [facility] OR;  Service: Orthopedics;  Laterality: N/A;  3C BED   CYST EXCISION  2018   back--done in a MD office   NO PAST SURGERIES     Social History: Social History   Socioeconomic History   Marital status: Married    Spouse name: Not on file   Number of children: Not on file   Years of education: Not on file   Highest education level: Not on file  Occupational History   Not on file  Tobacco Use   Smoking status: Former    Current packs/day: 0.00    Average packs/day: 1.5 packs/day for 15.0 years (22.5 ttl pk-yrs)    Types: Cigarettes    Start date: 12/23/2001    Quit date: 12/23/2016    Years since quitting: 6.5   Smokeless tobacco: Never  Substance and Sexual Activity   Alcohol use: No   Drug use: No   Sexual activity: Yes    Partners: Female    Birth control/protection: None  Other Topics Concern   Not on file  Social History Narrative   Not on file   Social Drivers of Health   Financial Resource Strain: Not on file  Food Insecurity: Not on file  Transportation Needs: Not on file  Physical Activity: Not on file  Stress: Not on file   Social Connections: Unknown (10/05/2022)   Received from [employer]   Social Network    Social Network: Not on file   Family History: Family History  Problem Relation Age of Onset   Diabetes Mother    Hypertension Mother    Heart attack Mother    Cancer Maternal Grandmother    Hypertension Maternal Grandmother    Diabetes Maternal Grandmother    Heart attack Maternal Grandmother    Hypertension Maternal Grandfather    Heart attack Maternal Grandfather    Allergies: No Known Allergies Medications: See med rec.  Review of Systems: No headache, visual changes, nausea, vomiting, diarrhea, constipation, dizziness, abdominal pain, skin rash, fevers, chills, night sweats, swollen lymph nodes, weight loss, chest pain, body aches, joint swelling, muscle aches, shortness of breath, mood changes, visual or auditory hallucinations.  Objective:    General: Well Developed, well nourished, and in no acute distress.  Neuro: Alert and oriented x3, extra-ocular muscles intact, sensation grossly intact. Cranial nerves II through XII are intact, motor, sensory, and coordinative functions are all intact. HEENT: Normocephalic, atraumatic, pupils equal round reactive to light, neck supple, no masses, no lymphadenopathy, thyroid nonpalpable. Oropharynx, nasopharynx, external ear canals are unremarkable. Skin: Warm and dry, no rashes noted.  Cardiac: Regular rate and rhythm, no murmurs rubs or gallops.  Respiratory: Clear to auscultation bilaterally. Not using  accessory muscles, speaking in full sentences.  Abdominal: Soft, nontender, nondistended, positive bowel sounds, no masses, no organomegaly.  Musculoskeletal: Shoulder, elbow, wrist, hip, knee, ankle stable, and with full range of motion.  Impression and Recommendations:    The patient was counselled, risk factors were discussed, anticipatory guidance given.  Annual physical exam Fasting annual physical as above checking all routine labs,  up-to-date on colon cancer screening, he does need his diabetic eye exam. His estranged father passed away yesterday, we discussed grief and avenues to seek help if need be.  Subclinical hypothyroidism Elevated TSH, normal T3, T4, this represents subclinical hypothyroidism, we do not need to treat this unless TSH rises to above 10, this would be to prevent cardiovascular mortality. No benefit in treating TSH below 10.  ____________________________________________ Joselyn Nicely. Sandy Crumb, M.D., ABFM., CAQSM., AME. Primary Care and Sports Medicine [facility]  Adjunct Professor of [facility] Medicine  [employer]  Restaurant manager, fast food

## 2023-06-20 ENCOUNTER — Ambulatory Visit: Payer: Self-pay | Admitting: Sports Medicine

## 2023-06-20 LAB — LIPID PANEL
Chol/HDL Ratio: 6 ratio — ABNORMAL HIGH (ref 0.0–5.0)
Cholesterol, Total: 167 mg/dL (ref 100–199)
HDL: 28 mg/dL — ABNORMAL LOW (ref >39)
LDL Chol Calc (NIH): 106 mg/dL — ABNORMAL HIGH (ref 0–99)
Triglycerides: 186 mg/dL — ABNORMAL HIGH (ref 0–149)
VLDL Cholesterol Cal: 33 mg/dL (ref 5–40)

## 2023-06-20 LAB — COMPREHENSIVE METABOLIC PANEL WITH GFR
ALT: 27 IU/L (ref 0–44)
AST: 19 IU/L (ref 0–40)
Albumin: 4.4 g/dL (ref 4.1–5.1)
Alkaline Phosphatase: 106 IU/L (ref 44–121)
BUN/Creatinine Ratio: 8 — ABNORMAL LOW (ref 9–20)
BUN: 10 mg/dL (ref 6–24)
Bilirubin Total: 0.3 mg/dL (ref 0.0–1.2)
CO2: 20 mmol/L (ref 20–29)
Calcium: 9.4 mg/dL (ref 8.7–10.2)
Chloride: 104 mmol/L (ref 96–106)
Creatinine, Ser: 1.2 mg/dL (ref 0.76–1.27)
Globulin, Total: 2.5 g/dL (ref 1.5–4.5)
Glucose: 103 mg/dL — ABNORMAL HIGH (ref 70–99)
Potassium: 4.2 mmol/L (ref 3.5–5.2)
Sodium: 140 mmol/L (ref 134–144)
Total Protein: 6.9 g/dL (ref 6.0–8.5)
eGFR: 74 mL/min/{1.73_m2} (ref >59)

## 2023-06-20 LAB — TSH: TSH: 4.73 u[IU]/mL — ABNORMAL HIGH (ref 0.450–4.500)

## 2023-06-20 LAB — CBC
Hematocrit: 42.1 % (ref 37.5–51.0)
Hemoglobin: 13.7 g/dL (ref 13.0–17.7)
MCH: 29.3 pg (ref 26.6–33.0)
MCHC: 32.5 g/dL (ref 31.5–35.7)
MCV: 90 fL (ref 79–97)
Platelets: 152 10*3/uL (ref 150–450)
RBC: 4.68 x10E6/uL (ref 4.14–5.80)
RDW: 13.3 % (ref 11.6–15.4)
WBC: 5.9 10*3/uL (ref 3.4–10.8)

## 2023-06-20 LAB — PSA, TOTAL AND FREE
PSA, Free Pct: 42.2 %
PSA, Free: 0.38 ng/mL
Prostate Specific Ag, Serum: 0.9 ng/mL (ref 0.0–4.0)

## 2023-06-20 LAB — HEMOGLOBIN A1C
Est. average glucose Bld gHb Est-mCnc: 114 mg/dL
Hgb A1c MFr Bld: 5.6 % (ref 4.8–5.6)

## 2023-06-20 LAB — MICROALBUMIN / CREATININE URINE RATIO
Creatinine, Urine: 104.9 mg/dL
Microalb/Creat Ratio: 3 mg/g{creat} (ref 0–29)
Microalbumin, Urine: 3.3 ug/mL

## 2023-06-20 NOTE — Addendum Note (Signed)
 Addended by: Gean Keels on: 06/20/2023 12:38 PM   Modules accepted: Orders

## 2023-06-20 NOTE — Addendum Note (Signed)
 Addended by: OLIVA-AVELLANEDA, Lamiya Naas L on: 06/20/2023 04:17 PM   Modules accepted: Orders

## 2023-06-26 LAB — SPECIMEN STATUS REPORT

## 2023-06-26 LAB — TSH+T4F+T3FREE
Free T4: 0.84 ng/dL (ref 0.82–1.77)
T3, Free: 3.3 pg/mL (ref 2.0–4.4)
TSH: 4.76 u[IU]/mL — ABNORMAL HIGH (ref 0.450–4.500)

## 2023-06-26 NOTE — Assessment & Plan Note (Signed)
 Elevated TSH, normal T3, T4, this represents subclinical hypothyroidism, we do not need to treat this unless TSH rises to above 10, this would be to prevent cardiovascular mortality. No benefit in treating TSH below 10.

## 2023-07-25 ENCOUNTER — Encounter: Payer: Self-pay | Admitting: Sports Medicine

## 2023-08-01 ENCOUNTER — Other Ambulatory Visit: Payer: Self-pay | Admitting: Sports Medicine

## 2023-08-01 DIAGNOSIS — E781 Pure hyperglyceridemia: Secondary | ICD-10-CM

## 2023-08-24 ENCOUNTER — Encounter: Payer: Self-pay | Admitting: Advanced Practice Midwife

## 2023-08-27 ENCOUNTER — Encounter (INDEPENDENT_AMBULATORY_CARE_PROVIDER_SITE_OTHER): Payer: Self-pay | Admitting: Sports Medicine

## 2023-08-27 DIAGNOSIS — K5790 Diverticulosis of intestine, part unspecified, without perforation or abscess without bleeding: Secondary | ICD-10-CM

## 2023-08-27 MED ORDER — METRONIDAZOLE 500 MG PO TABS: 500.0000 mg | ORAL_TABLET | Freq: Two times a day (BID) | ORAL | 0 refills | Status: AC

## 2023-08-27 MED ORDER — CIPROFLOXACIN HCL 750 MG PO TABS: 750.0000 mg | ORAL_TABLET | Freq: Two times a day (BID) | ORAL | 0 refills | Status: AC

## 2023-08-27 NOTE — Telephone Encounter (Signed)

## 2023-10-09 ENCOUNTER — Encounter: Payer: Self-pay | Admitting: Sports Medicine

## 2024-04-09 ENCOUNTER — Encounter: Admitting: Urgent Care
